# Patient Record
Sex: Female | Born: 1995 | Race: White | Hispanic: No | Marital: Single | Smoking: Former smoker
Health system: Southern US, Community
[De-identification: ages and names within clinical notes are randomized; demographics above are authoritative.]

## PROBLEM LIST (undated history)

## (undated) ENCOUNTER — Inpatient Hospital Stay (HOSPITAL_COMMUNITY): Payer: Self-pay

## (undated) DIAGNOSIS — T7422XA Child sexual abuse, confirmed, initial encounter: Secondary | ICD-10-CM

## (undated) DIAGNOSIS — E119 Type 2 diabetes mellitus without complications: Secondary | ICD-10-CM

## (undated) DIAGNOSIS — F319 Bipolar disorder, unspecified: Secondary | ICD-10-CM

## (undated) DIAGNOSIS — R112 Nausea with vomiting, unspecified: Secondary | ICD-10-CM

## (undated) DIAGNOSIS — Z9889 Other specified postprocedural states: Secondary | ICD-10-CM

## (undated) DIAGNOSIS — T7412XA Child physical abuse, confirmed, initial encounter: Secondary | ICD-10-CM

## (undated) HISTORY — PX: NO PAST SURGERIES: SHX2092

## (undated) HISTORY — PX: HERNIA REPAIR: SHX51

## (undated) HISTORY — DX: Child physical abuse, confirmed, initial encounter: T74.12XA

## (undated) HISTORY — DX: Child sexual abuse, confirmed, initial encounter: T74.22XA

## (undated) HISTORY — DX: Bipolar disorder, unspecified: F31.9

---
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
## 2014-01-04 ENCOUNTER — Other Ambulatory Visit: Payer: Self-pay

## 2014-01-04 ENCOUNTER — Ambulatory Visit (HOSPITAL_COMMUNITY)
Admission: RE | Admit: 2014-01-04 | Discharge: 2014-01-04 | Disposition: A | Discharge status: Home / Self Care | Payer: Medicaid Other | Source: Ambulatory Visit | Attending: Obstetrics & Gynecology | Admitting: Obstetrics & Gynecology

## 2014-01-04 DIAGNOSIS — O351XX0 Maternal care for (suspected) chromosomal abnormality in fetus, not applicable or unspecified: Secondary | ICD-10-CM | POA: Insufficient documentation

## 2014-01-04 DIAGNOSIS — O3510X0 Maternal care for (suspected) chromosomal abnormality in fetus, unspecified, not applicable or unspecified: Secondary | ICD-10-CM | POA: Insufficient documentation

## 2014-01-04 DIAGNOSIS — Z3689 Encounter for other specified antenatal screening: Secondary | ICD-10-CM | POA: Insufficient documentation

## 2014-01-04 DIAGNOSIS — O0990 Supervision of high risk pregnancy, unspecified, unspecified trimester: Secondary | ICD-10-CM | POA: Insufficient documentation

## 2014-01-04 DIAGNOSIS — Z3682 Encounter for antenatal screening for nuchal translucency: Secondary | ICD-10-CM

## 2014-01-05 ENCOUNTER — Encounter: Payer: Self-pay | Admitting: Obstetrics & Gynecology

## 2014-01-17 ENCOUNTER — Ambulatory Visit (INDEPENDENT_AMBULATORY_CARE_PROVIDER_SITE_OTHER): Payer: Medicaid Other | Admitting: Obstetrics & Gynecology

## 2014-01-17 VITALS — BP 118/85 | Wt 207.0 lb

## 2014-01-17 DIAGNOSIS — Z3490 Encounter for supervision of normal pregnancy, unspecified, unspecified trimester: Secondary | ICD-10-CM

## 2014-01-17 DIAGNOSIS — Z23 Encounter for immunization: Secondary | ICD-10-CM

## 2014-01-17 DIAGNOSIS — E669 Obesity, unspecified: Secondary | ICD-10-CM

## 2014-01-17 DIAGNOSIS — O99210 Obesity complicating pregnancy, unspecified trimester: Secondary | ICD-10-CM

## 2014-01-17 DIAGNOSIS — Z3480 Encounter for supervision of other normal pregnancy, unspecified trimester: Secondary | ICD-10-CM

## 2014-01-17 NOTE — Progress Notes (Signed)
P-90 

## 2014-01-17 NOTE — Patient Instructions (Signed)
Return to clinic for any obstetric concerns or go to [facility] for evaluation  

## 2014-01-17 NOTE — Progress Notes (Signed)
1 hr GTT today.  Flu shot to be given today too.Antomy scan ordered.  No other complaints or concerns.  Routine obstetric precautions reviewed.

## 2014-01-18 LAB — GLUCOSE TOLERANCE, 1 HOUR (50G) W/O FASTING: Glucose, 1 Hour GTT: 118 mg/dL (ref 70–140)

## 2014-01-19 ENCOUNTER — Encounter: Payer: Self-pay | Admitting: Obstetrics & Gynecology

## 2014-02-14 ENCOUNTER — Ambulatory Visit (INDEPENDENT_AMBULATORY_CARE_PROVIDER_SITE_OTHER): Payer: Medicaid Other | Admitting: Obstetrics & Gynecology

## 2014-02-14 VITALS — BP 117/82 | Wt 205.0 lb

## 2014-02-14 DIAGNOSIS — Z3480 Encounter for supervision of other normal pregnancy, unspecified trimester: Secondary | ICD-10-CM

## 2014-02-14 DIAGNOSIS — O99210 Obesity complicating pregnancy, unspecified trimester: Secondary | ICD-10-CM

## 2014-02-14 DIAGNOSIS — E669 Obesity, unspecified: Secondary | ICD-10-CM

## 2014-02-14 NOTE — Progress Notes (Signed)
P-105

## 2014-02-14 NOTE — Progress Notes (Signed)
Routine visit. Good FM. No problems. Has anatomy u/s next week. Early glucola today.

## 2014-02-16 ENCOUNTER — Telehealth: Payer: Self-pay | Admitting: *Deleted

## 2014-02-16 LAB — GLUCOSE TOLERANCE, 1 HOUR (50G) W/O FASTING: Glucose, 1 Hour GTT: 158 mg/dL — ABNORMAL HIGH (ref 70–140)

## 2014-02-16 NOTE — Telephone Encounter (Signed)
Attempted to call patient at the numbers provided and was told that she no longer lives there.  The lady that answered gave me a number that she believes is her fathers number.  I attempted this number and left a message that Becky Blake needed to get in touch with our office regarding test results.

## 2014-02-16 NOTE — Telephone Encounter (Signed)
Message copied by Barbara CowerNOGUES, Mansa Willers L on Wed Feb 16, 2014  2:24 PM ------      Message from: Nicholaus BloomVE, MYRA C      Created: Wed Feb 16, 2014  2:04 PM       She will need a 3 hour GTT.      Thanks ------

## 2014-02-22 ENCOUNTER — Ambulatory Visit (HOSPITAL_COMMUNITY): Payer: Medicaid Other

## 2014-02-25 ENCOUNTER — Other Ambulatory Visit: Payer: Medicaid Other

## 2014-02-28 ENCOUNTER — Encounter: Payer: Self-pay | Admitting: *Deleted

## 2014-02-28 ENCOUNTER — Other Ambulatory Visit (INDEPENDENT_AMBULATORY_CARE_PROVIDER_SITE_OTHER): Payer: Medicaid Other | Admitting: *Deleted

## 2014-02-28 DIAGNOSIS — O99810 Abnormal glucose complicating pregnancy: Secondary | ICD-10-CM

## 2014-02-28 NOTE — Progress Notes (Signed)
Pt here today for her 3 hr GTT 

## 2014-03-01 LAB — GLUCOSE TOLERANCE, 3 HOURS
GLUCOSE 3 HOUR GTT: 98 mg/dL (ref 70–144)
GLUCOSE, 2 HOUR-GESTATIONAL: 104 mg/dL (ref 70–164)
Glucose Tolerance, 1 hour: 134 mg/dL (ref 70–189)
Glucose Tolerance, Fasting: 71 mg/dL (ref 70–104)

## 2014-03-03 ENCOUNTER — Ambulatory Visit (HOSPITAL_COMMUNITY)
Admission: RE | Admit: 2014-03-03 | Discharge: 2014-03-03 | Disposition: A | Discharge status: Home / Self Care | Payer: Medicaid Other | Source: Ambulatory Visit | Attending: Obstetrics & Gynecology | Admitting: Obstetrics & Gynecology

## 2014-03-03 ENCOUNTER — Other Ambulatory Visit: Payer: Self-pay | Admitting: Obstetrics & Gynecology

## 2014-03-03 DIAGNOSIS — O99210 Obesity complicating pregnancy, unspecified trimester: Secondary | ICD-10-CM

## 2014-03-03 DIAGNOSIS — Z3689 Encounter for other specified antenatal screening: Secondary | ICD-10-CM | POA: Insufficient documentation

## 2014-03-03 DIAGNOSIS — Z3490 Encounter for supervision of normal pregnancy, unspecified, unspecified trimester: Secondary | ICD-10-CM

## 2014-03-10 ENCOUNTER — Inpatient Hospital Stay (HOSPITAL_COMMUNITY)
Admission: AD | Admit: 2014-03-10 | Discharge: 2014-03-10 | Disposition: A | Discharge status: Home / Self Care | Payer: Medicaid Other | Source: Ambulatory Visit | Attending: Obstetrics & Gynecology | Admitting: Obstetrics & Gynecology

## 2014-03-10 ENCOUNTER — Encounter (HOSPITAL_COMMUNITY): Payer: Self-pay | Admitting: *Deleted

## 2014-03-10 DIAGNOSIS — O26859 Spotting complicating pregnancy, unspecified trimester: Secondary | ICD-10-CM | POA: Insufficient documentation

## 2014-03-10 DIAGNOSIS — O99891 Other specified diseases and conditions complicating pregnancy: Secondary | ICD-10-CM | POA: Insufficient documentation

## 2014-03-10 DIAGNOSIS — F319 Bipolar disorder, unspecified: Secondary | ICD-10-CM

## 2014-03-10 DIAGNOSIS — Z3490 Encounter for supervision of normal pregnancy, unspecified, unspecified trimester: Secondary | ICD-10-CM

## 2014-03-10 DIAGNOSIS — O2390 Unspecified genitourinary tract infection in pregnancy, unspecified trimester: Secondary | ICD-10-CM | POA: Insufficient documentation

## 2014-03-10 DIAGNOSIS — O99340 Other mental disorders complicating pregnancy, unspecified trimester: Secondary | ICD-10-CM

## 2014-03-10 DIAGNOSIS — O9989 Other specified diseases and conditions complicating pregnancy, childbirth and the puerperium: Principal | ICD-10-CM

## 2014-03-10 DIAGNOSIS — B9689 Other specified bacterial agents as the cause of diseases classified elsewhere: Secondary | ICD-10-CM | POA: Insufficient documentation

## 2014-03-10 DIAGNOSIS — O99210 Obesity complicating pregnancy, unspecified trimester: Secondary | ICD-10-CM

## 2014-03-10 DIAGNOSIS — N760 Acute vaginitis: Secondary | ICD-10-CM | POA: Insufficient documentation

## 2014-03-10 DIAGNOSIS — R109 Unspecified abdominal pain: Secondary | ICD-10-CM | POA: Insufficient documentation

## 2014-03-10 DIAGNOSIS — K219 Gastro-esophageal reflux disease without esophagitis: Secondary | ICD-10-CM | POA: Insufficient documentation

## 2014-03-10 DIAGNOSIS — Z87891 Personal history of nicotine dependence: Secondary | ICD-10-CM | POA: Insufficient documentation

## 2014-03-10 DIAGNOSIS — A499 Bacterial infection, unspecified: Secondary | ICD-10-CM | POA: Insufficient documentation

## 2014-03-10 LAB — URINALYSIS, ROUTINE W REFLEX MICROSCOPIC
Bilirubin Urine: NEGATIVE
GLUCOSE, UA: NEGATIVE mg/dL
Hgb urine dipstick: NEGATIVE
Ketones, ur: NEGATIVE mg/dL
LEUKOCYTES UA: NEGATIVE
Nitrite: NEGATIVE
PROTEIN: NEGATIVE mg/dL
Specific Gravity, Urine: 1.01 (ref 1.005–1.030)
UROBILINOGEN UA: 0.2 mg/dL (ref 0.0–1.0)
pH: 6.5 (ref 5.0–8.0)

## 2014-03-10 LAB — WET PREP, GENITAL
Trich, Wet Prep: NONE SEEN
Yeast Wet Prep HPF POC: NONE SEEN

## 2014-03-10 LAB — OB RESULTS CONSOLE GC/CHLAMYDIA
CHLAMYDIA, DNA PROBE: NEGATIVE
Gonorrhea: NEGATIVE

## 2014-03-10 MED ORDER — FAMOTIDINE 40 MG PO TABS: 40.0000 mg | ORAL_TABLET | Freq: Every day | ORAL | Status: DC

## 2014-03-10 MED ORDER — GI COCKTAIL ~~LOC~~: 30.0000 mL | Freq: Once | ORAL | Status: DC

## 2014-03-10 MED ORDER — GI COCKTAIL ~~LOC~~
30.0000 mL | Freq: Once | ORAL | Status: AC
  Administered 2014-03-10: 30 mL via ORAL
  Filled 2014-03-10: qty 30

## 2014-03-10 MED ORDER — METRONIDAZOLE 500 MG PO TABS
500.0000 mg | ORAL_TABLET | Freq: Two times a day (BID) | ORAL | Status: DC
Start: 2014-03-10 — End: 2014-04-11

## 2014-03-10 NOTE — Discharge Instructions (Signed)
Second Trimester of Pregnancy The second trimester is from week 13 through week 28, months 4 through 6. The second trimester is often a time when you feel your best. Your body has also adjusted to being pregnant, and you begin to feel better physically. Usually, morning sickness has lessened or quit completely, you may have more energy, and you may have an increase in appetite. The second trimester is also a time when the fetus is growing rapidly. At the end of the sixth month, the fetus is about 9 inches long and weighs about 1 pounds. You will likely begin to feel the baby move (quickening) between 18 and 20 weeks of the pregnancy. BODY CHANGES Your body goes through many changes during pregnancy. The changes vary from woman to woman.   Your weight will continue to increase. You will notice your lower abdomen bulging out.  You may begin to get stretch marks on your hips, abdomen, and breasts.  You may develop headaches that can be relieved by medicines approved by your caregiver.  You may urinate more often because the fetus is pressing on your bladder.  You may develop or continue to have heartburn as a result of your pregnancy.  You may develop constipation because certain hormones are causing the muscles that push waste through your intestines to slow down.  You may develop hemorrhoids or swollen, bulging veins (varicose veins).  You may have back pain because of the weight gain and pregnancy hormones relaxing your joints between the bones in your pelvis and as a result of a shift in weight and the muscles that support your balance.  Your breasts will continue to grow and be tender.  Your gums may bleed and may be sensitive to brushing and flossing.  Dark spots or blotches (chloasma, mask of pregnancy) may develop on your face. This will likely fade after the baby is born.  A dark line from your belly button to the pubic area (linea nigra) may appear. This will likely fade after the  baby is born. WHAT TO EXPECT AT YOUR PRENATAL VISITS During a routine prenatal visit:  You will be weighed to make sure you and the fetus are growing normally.  Your blood pressure will be taken.  Your abdomen will be measured to track your baby's growth.  The fetal heartbeat will be listened to.  Any test results from the previous visit will be discussed. Your caregiver may ask you:  How you are feeling.  If you are feeling the baby move.  If you have had any abnormal symptoms, such as leaking fluid, bleeding, severe headaches, or abdominal cramping.  If you have any questions. Other tests that may be performed during your second trimester include:  Blood tests that check for:  Low iron levels (anemia).  Gestational diabetes (between 24 and 28 weeks).  Rh antibodies.  Urine tests to check for infections, diabetes, or protein in the urine.  An ultrasound to confirm the proper growth and development of the baby.  An amniocentesis to check for possible genetic problems.  Fetal screens for spina bifida and Down syndrome. HOME CARE INSTRUCTIONS   Avoid all smoking, herbs, alcohol, and unprescribed drugs. These chemicals affect the formation and growth of the baby.  Follow your caregiver's instructions regarding medicine use. There are medicines that are either safe or unsafe to take during pregnancy.  Exercise only as directed by your caregiver. Experiencing uterine cramps is a good sign to stop exercising.  Continue to eat regular,   healthy meals.  Wear a good support bra for breast tenderness.  Do not use hot tubs, steam rooms, or saunas.  Wear your seat belt at all times when driving.  Avoid raw meat, uncooked cheese, cat litter boxes, and soil used by cats. These carry germs that can cause birth defects in the baby.  Take your prenatal vitamins.  Try taking a stool softener (if your caregiver approves) if you develop constipation. Eat more high-fiber foods,  such as fresh vegetables or fruit and whole grains. Drink plenty of fluids to keep your urine clear or pale yellow.  Take warm sitz baths to soothe any pain or discomfort caused by hemorrhoids. Use hemorrhoid cream if your caregiver approves.  If you develop varicose veins, wear support hose. Elevate your feet for 15 minutes, 3 4 times a day. Limit salt in your diet.  Avoid heavy lifting, wear low heel shoes, and practice good posture.  Rest with your legs elevated if you have leg cramps or low back pain.  Visit your dentist if you have not gone yet during your pregnancy. Use a soft toothbrush to brush your teeth and be gentle when you floss.  A sexual relationship may be continued unless your caregiver directs you otherwise.  Continue to go to all your prenatal visits as directed by your caregiver. SEEK MEDICAL CARE IF:   You have dizziness.  You have mild pelvic cramps, pelvic pressure, or nagging pain in the abdominal area.  You have persistent nausea, vomiting, or diarrhea.  You have a bad smelling vaginal discharge.  You have pain with urination. SEEK IMMEDIATE MEDICAL CARE IF:   You have a fever.  You are leaking fluid from your vagina.  You have spotting or bleeding from your vagina.  You have severe abdominal cramping or pain.  You have rapid weight gain or loss.  You have shortness of breath with chest pain.  You notice sudden or extreme swelling of your face, hands, ankles, feet, or legs.  You have not felt your baby move in over an hour.  You have severe headaches that do not go away with medicine.  You have vision changes. Document Released: 12/10/2001 Document Revised: 08/18/2013 Document Reviewed: 02/16/2013 [employer] Patient Information 2014 [employer].  

## 2014-03-10 NOTE — MAU Provider Note (Signed)
Attestation of Attending Supervision of Obstetric Fellow: Evaluation and management procedures were performed by the Obstetric Fellow under my supervision and collaboration.  I have reviewed the Obstetric Fellow's note and chart, and I agree with the management and plan.  Maurina Fawaz, MD, FACOG Attending Obstetrician & Gynecologist Faculty Practice, [facility]   

## 2014-03-10 NOTE — MAU Note (Signed)
Patient state she is having abdominal pain at the fundus mostly to the left side that started this am. States she had pink spotting after the pain started. Not wearing a pad. Feels nausea, no vomiting. Has felt fetal movement before but not today.
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
## 2014-03-11 LAB — GC/CHLAMYDIA PROBE AMP
CT Probe RNA: NEGATIVE
GC PROBE AMP APTIMA: NEGATIVE

## 2014-03-14 ENCOUNTER — Encounter: Payer: Medicaid Other | Admitting: Obstetrics & Gynecology

## 2014-03-15 ENCOUNTER — Encounter: Payer: Self-pay | Admitting: Obstetrics & Gynecology

## 2014-03-15 ENCOUNTER — Ambulatory Visit (INDEPENDENT_AMBULATORY_CARE_PROVIDER_SITE_OTHER): Payer: Medicaid Other | Admitting: Obstetrics & Gynecology

## 2014-03-15 VITALS — BP 126/79 | Wt 210.0 lb

## 2014-03-15 DIAGNOSIS — Z3490 Encounter for supervision of normal pregnancy, unspecified, unspecified trimester: Secondary | ICD-10-CM

## 2014-03-15 DIAGNOSIS — Z3480 Encounter for supervision of other normal pregnancy, unspecified trimester: Secondary | ICD-10-CM

## 2014-03-15 NOTE — Progress Notes (Signed)
P = [phone] 

## 2014-03-15 NOTE — Addendum Note (Signed)
Addended by: Barbara CowerNOGUES, Rielle Schlauch L on: 03/15/2014 03:51 PM   Modules accepted: Orders

## 2014-03-15 NOTE — Patient Instructions (Signed)
Second Trimester of Pregnancy The second trimester is from week 13 through week 28, months 4 through 6. The second trimester is often a time when you feel your best. Your body has also adjusted to being pregnant, and you begin to feel better physically. Usually, morning sickness has lessened or quit completely, you may have more energy, and you may have an increase in appetite. The second trimester is also a time when the fetus is growing rapidly. At the end of the sixth month, the fetus is about 9 inches long and weighs about 1 pounds. You will likely begin to feel the baby move (quickening) between 18 and 20 weeks of the pregnancy. BODY CHANGES Your body goes through many changes during pregnancy. The changes vary from woman to woman.   Your weight will continue to increase. You will notice your lower abdomen bulging out.  You may begin to get stretch marks on your hips, abdomen, and breasts.  You may develop headaches that can be relieved by medicines approved by your caregiver.  You may urinate more often because the fetus is pressing on your bladder.  You may develop or continue to have heartburn as a result of your pregnancy.  You may develop constipation because certain hormones are causing the muscles that push waste through your intestines to slow down.  You may develop hemorrhoids or swollen, bulging veins (varicose veins).  You may have back pain because of the weight gain and pregnancy hormones relaxing your joints between the bones in your pelvis and as a result of a shift in weight and the muscles that support your balance.  Your breasts will continue to grow and be tender.  Your gums may bleed and may be sensitive to brushing and flossing.  Dark spots or blotches (chloasma, mask of pregnancy) may develop on your face. This will likely fade after the baby is born.  A dark line from your belly button to the pubic area (linea nigra) may appear. This will likely fade after the  baby is born. WHAT TO EXPECT AT YOUR PRENATAL VISITS During a routine prenatal visit:  You will be weighed to make sure you and the fetus are growing normally.  Your blood pressure will be taken.  Your abdomen will be measured to track your baby's growth.  The fetal heartbeat will be listened to.  Any test results from the previous visit will be discussed. Your caregiver may ask you:  How you are feeling.  If you are feeling the baby move.  If you have had any abnormal symptoms, such as leaking fluid, bleeding, severe headaches, or abdominal cramping.  If you have any questions. Other tests that may be performed during your second trimester include:  Blood tests that check for:  Low iron levels (anemia).  Gestational diabetes (between 24 and 28 weeks).  Rh antibodies.  Urine tests to check for infections, diabetes, or protein in the urine.  An ultrasound to confirm the proper growth and development of the baby.  An amniocentesis to check for possible genetic problems.  Fetal screens for spina bifida and Down syndrome. HOME CARE INSTRUCTIONS   Avoid all smoking, herbs, alcohol, and unprescribed drugs. These chemicals affect the formation and growth of the baby.  Follow your caregiver's instructions regarding medicine use. There are medicines that are either safe or unsafe to take during pregnancy.  Exercise only as directed by your caregiver. Experiencing uterine cramps is a good sign to stop exercising.  Continue to eat regular,   healthy meals.  Wear a good support bra for breast tenderness.  Do not use hot tubs, steam rooms, or saunas.  Wear your seat belt at all times when driving.  Avoid raw meat, uncooked cheese, cat litter boxes, and soil used by cats. These carry germs that can cause birth defects in the baby.  Take your prenatal vitamins.  Try taking a stool softener (if your caregiver approves) if you develop constipation. Eat more high-fiber foods,  such as fresh vegetables or fruit and whole grains. Drink plenty of fluids to keep your urine clear or pale yellow.  Take warm sitz baths to soothe any pain or discomfort caused by hemorrhoids. Use hemorrhoid cream if your caregiver approves.  If you develop varicose veins, wear support hose. Elevate your feet for 15 minutes, 3 4 times a day. Limit salt in your diet.  Avoid heavy lifting, wear low heel shoes, and practice good posture.  Rest with your legs elevated if you have leg cramps or low back pain.  Visit your dentist if you have not gone yet during your pregnancy. Use a soft toothbrush to brush your teeth and be gentle when you floss.  A sexual relationship may be continued unless your caregiver directs you otherwise.  Continue to go to all your prenatal visits as directed by your caregiver. SEEK MEDICAL CARE IF:   You have dizziness.  You have mild pelvic cramps, pelvic pressure, or nagging pain in the abdominal area.  You have persistent nausea, vomiting, or diarrhea.  You have a bad smelling vaginal discharge.  You have pain with urination. SEEK IMMEDIATE MEDICAL CARE IF:   You have a fever.  You are leaking fluid from your vagina.  You have spotting or bleeding from your vagina.  You have severe abdominal cramping or pain.  You have rapid weight gain or loss.  You have shortness of breath with chest pain.  You notice sudden or extreme swelling of your face, hands, ankles, feet, or legs.  You have not felt your baby move in over an hour.  You have severe headaches that do not go away with medicine.  You have vision changes. Document Released: 12/10/2001 Document Revised: 08/18/2013 Document Reviewed: 02/16/2013 [employer] Patient Information 2014 [employer].  

## 2014-03-15 NOTE — Progress Notes (Signed)
Quad screen today F/u anatomy scan No problems

## 2014-03-17 LAB — ALPHA FETOPROTEIN, MATERNAL
AFP: 130 IU/mL
Curr Gest Age: 22.5 wks.days
MOM FOR AFP: 2.08
Open Spina bifida: NEGATIVE

## 2014-03-21 ENCOUNTER — Encounter: Payer: Self-pay | Admitting: Obstetrics & Gynecology

## 2014-04-11 ENCOUNTER — Ambulatory Visit (INDEPENDENT_AMBULATORY_CARE_PROVIDER_SITE_OTHER): Payer: Medicaid Other | Admitting: Family Medicine

## 2014-04-11 ENCOUNTER — Encounter: Payer: Self-pay | Admitting: Family Medicine

## 2014-04-11 VITALS — BP 112/80 | Wt 216.0 lb

## 2014-04-11 DIAGNOSIS — Z3480 Encounter for supervision of other normal pregnancy, unspecified trimester: Secondary | ICD-10-CM

## 2014-04-11 DIAGNOSIS — Z3490 Encounter for supervision of normal pregnancy, unspecified, unspecified trimester: Secondary | ICD-10-CM

## 2014-04-11 NOTE — Progress Notes (Signed)
P-[phone] 

## 2014-04-11 NOTE — Patient Instructions (Signed)
Third Trimester of Pregnancy The third trimester is from week 29 through week 42, months 7 through 9. The third trimester is a time when the fetus is growing rapidly. At the end of the ninth month, the fetus is about 20 inches in length and weighs 6 10 pounds.  BODY CHANGES Your body goes through many changes during pregnancy. The changes vary from woman to woman.   Your weight will continue to increase. You can expect to gain 25 35 pounds (11 16 kg) by the end of the pregnancy.  You may begin to get stretch marks on your hips, abdomen, and breasts.  You may urinate more often because the fetus is moving lower into your pelvis and pressing on your bladder.  You may develop or continue to have heartburn as a result of your pregnancy.  You may develop constipation because certain hormones are causing the muscles that push waste through your intestines to slow down.  You may develop hemorrhoids or swollen, bulging veins (varicose veins).  You may have pelvic pain because of the weight gain and pregnancy hormones relaxing your joints between the bones in your pelvis. Back aches may result from over exertion of the muscles supporting your posture.  Your breasts will continue to grow and be tender. A yellow discharge may leak from your breasts called colostrum.  Your belly button may stick out.  You may feel short of breath because of your expanding uterus.  You may notice the fetus "dropping," or moving lower in your abdomen.  You may have a bloody mucus discharge. This usually occurs a few days to a week before labor begins.  Your cervix becomes thin and soft (effaced) near your due date. WHAT TO EXPECT AT YOUR PRENATAL EXAMS  You will have prenatal exams every 2 weeks until week 36. Then, you will have weekly prenatal exams. During a routine prenatal visit:  You will be weighed to make sure you and the fetus are growing normally.  Your blood pressure is taken.  Your abdomen will  be measured to track your baby's growth.  The fetal heartbeat will be listened to.  Any test results from the previous visit will be discussed.  You may have a cervical check near your due date to see if you have effaced. At around 36 weeks, your caregiver will check your cervix. At the same time, your caregiver will also perform a test on the secretions of the vaginal tissue. This test is to determine if a type of bacteria, Group B streptococcus, is present. Your caregiver will explain this further. Your caregiver may ask you:  What your birth plan is.  How you are feeling.  If you are feeling the baby move.  If you have had any abnormal symptoms, such as leaking fluid, bleeding, severe headaches, or abdominal cramping.  If you have any questions. Other tests or screenings that may be performed during your third trimester include:  Blood tests that check for low iron levels (anemia).  Fetal testing to check the health, activity level, and growth of the fetus. Testing is done if you have certain medical conditions or if there are problems during the pregnancy. FALSE LABOR You may feel small, irregular contractions that eventually go away. These are called Braxton Hicks contractions, or false labor. Contractions may last for hours, days, or even weeks before true labor sets in. If contractions come at regular intervals, intensify, or become painful, it is best to be seen by your caregiver.    SIGNS OF LABOR   Menstrual-like cramps.  Contractions that are 5 minutes apart or less.  Contractions that start on the top of the uterus and spread down to the lower abdomen and back.  A sense of increased pelvic pressure or back pain.  A watery or bloody mucus discharge that comes from the vagina. If you have any of these signs before the 37th week of pregnancy, call your caregiver right away. You need to go to the hospital to get checked immediately. HOME CARE INSTRUCTIONS   Avoid all  smoking, herbs, alcohol, and unprescribed drugs. These chemicals affect the formation and growth of the baby.  Follow your caregiver's instructions regarding medicine use. There are medicines that are either safe or unsafe to take during pregnancy.  Exercise only as directed by your caregiver. Experiencing uterine cramps is a good sign to stop exercising.  Continue to eat regular, healthy meals.  Wear a good support bra for breast tenderness.  Do not use hot tubs, steam rooms, or saunas.  Wear your seat belt at all times when driving.  Avoid raw meat, uncooked cheese, cat litter boxes, and soil used by cats. These carry germs that can cause birth defects in the baby.  Take your prenatal vitamins.  Try taking a stool softener (if your caregiver approves) if you develop constipation. Eat more high-fiber foods, such as fresh vegetables or fruit and whole grains. Drink plenty of fluids to keep your urine clear or pale yellow.  Take warm sitz baths to soothe any pain or discomfort caused by hemorrhoids. Use hemorrhoid cream if your caregiver approves.  If you develop varicose veins, wear support hose. Elevate your feet for 15 minutes, 3 4 times a day. Limit salt in your diet.  Avoid heavy lifting, wear low heal shoes, and practice good posture.  Rest a lot with your legs elevated if you have leg cramps or low back pain.  Visit your dentist if you have not gone during your pregnancy. Use a soft toothbrush to brush your teeth and be gentle when you floss.  A sexual relationship may be continued unless your caregiver directs you otherwise.  Do not travel far distances unless it is absolutely necessary and only with the approval of your caregiver.  Take prenatal classes to understand, practice, and ask questions about the labor and delivery.  Make a trial run to the hospital.  Pack your hospital bag.  Prepare the baby's nursery.  Continue to go to all your prenatal visits as directed  by your caregiver. SEEK MEDICAL CARE IF:  You are unsure if you are in labor or if your water has broken.  You have dizziness.  You have mild pelvic cramps, pelvic pressure, or nagging pain in your abdominal area.  You have persistent nausea, vomiting, or diarrhea.  You have a bad smelling vaginal discharge.  You have pain with urination. SEEK IMMEDIATE MEDICAL CARE IF:   You have a fever.  You are leaking fluid from your vagina.  You have spotting or bleeding from your vagina.  You have severe abdominal cramping or pain.  You have rapid weight loss or gain.  You have shortness of breath with chest pain.  You notice sudden or extreme swelling of your face, hands, ankles, feet, or legs.  You have not felt your baby move in over an hour.  You have severe headaches that do not go away with medicine.  You have vision changes. Document Released: 12/10/2001 Document Revised: 08/18/2013 Document Reviewed:   02/16/2013 [employer] Patient Information 2014 [employer].  Breastfeeding Deciding to breastfeed is one of the best choices you can make for you and your baby. A change in hormones during pregnancy causes your breast tissue to grow and increases the number and size of your milk ducts. These hormones also allow proteins, sugars, and fats from your blood supply to make breast milk in your milk-producing glands. Hormones prevent breast milk from being released before your baby is born as well as prompt milk flow after birth. Once breastfeeding has begun, thoughts of your baby, as well as his or her sucking or crying, can stimulate the release of milk from your milk-producing glands.  BENEFITS OF BREASTFEEDING For Your Baby  Your first milk (colostrum) helps your baby's digestive system function better.   There are antibodies in your milk that help your baby fight off infections.   Your baby has a lower incidence of asthma, allergies, and sudden infant death syndrome.    The nutrients in breast milk are better for your baby than infant formulas and are designed uniquely for your baby's needs.   Breast milk improves your baby's brain development.   Your baby is less likely to develop other conditions, such as childhood obesity, asthma, or type 2 diabetes mellitus.  For You   Breastfeeding helps to create a very special bond between you and your baby.   Breastfeeding is convenient. Breast milk is always available at the correct temperature and costs nothing.   Breastfeeding helps to burn calories and helps you lose the weight gained during pregnancy.   Breastfeeding makes your uterus contract to its prepregnancy size faster and slows bleeding (lochia) after you give birth.   Breastfeeding helps to lower your risk of developing type 2 diabetes mellitus, osteoporosis, and breast or ovarian cancer later in life. SIGNS THAT YOUR BABY IS HUNGRY Early Signs of Hunger  Increased alertness or activity.  Stretching.  Movement of the head from side to side.  Movement of the head and opening of the mouth when the corner of the mouth or cheek is stroked (rooting).  Increased sucking sounds, smacking lips, cooing, sighing, or squeaking.  Hand-to-mouth movements.  Increased sucking of fingers or hands. Late Signs of Hunger  Fussing.  Intermittent crying. Extreme Signs of Hunger Signs of extreme hunger will require calming and consoling before your baby will be able to breastfeed successfully. Do not wait for the following signs of extreme hunger to occur before you initiate breastfeeding:   Restlessness.  A loud, strong cry.   Screaming. BREASTFEEDING BASICS Breastfeeding Initiation  Find a comfortable place to sit or lie down, with your neck and back well supported.  Place a pillow or rolled up blanket under your baby to bring him or her to the level of your breast (if you are seated). Nursing pillows are specially designed to help  support your arms and your baby while you breastfeed.  Make sure that your baby's abdomen is facing your abdomen.   Gently massage your breast. With your fingertips, massage from your chest wall toward your nipple in a circular motion. This encourages milk flow. You may need to continue this action during the feeding if your milk flows slowly.  Support your breast with 4 fingers underneath and your thumb above your nipple. Make sure your fingers are well away from your nipple and your baby's mouth.   Stroke your baby's lips gently with your finger or nipple.   When your baby's mouth is   open wide enough, quickly bring your baby to your breast, placing your entire nipple and as much of the colored area around your nipple (areola) as possible into your baby's mouth.   More areola should be visible above your baby's upper lip than below the lower lip.   Your baby's tongue should be between his or her lower gum and your breast.   Ensure that your baby's mouth is correctly positioned around your nipple (latched). Your baby's lips should create a seal on your breast and be turned out (everted).  It is common for your baby to suck about 2 3 minutes in order to start the flow of breast milk. Latching Teaching your baby how to latch on to your breast properly is very important. An improper latch can cause nipple pain and decreased milk supply for you and poor weight gain in your baby. Also, if your baby is not latched onto your nipple properly, he or she may swallow some air during feeding. This can make your baby fussy. Burping your baby when you switch breasts during the feeding can help to get rid of the air. However, teaching your baby to latch on properly is still the best way to prevent fussiness from swallowing air while breastfeeding. Signs that your baby has successfully latched on to your nipple:    Silent tugging or silent sucking, without causing you pain.   Swallowing heard  between every 3 4 sucks.    Muscle movement above and in front of his or her ears while sucking.  Signs that your baby has not successfully latched on to nipple:   Sucking sounds or smacking sounds from your baby while breastfeeding.  Nipple pain. If you think your baby has not latched on correctly, slip your finger into the corner of your baby's mouth to break the suction and place it between your baby's gums. Attempt breastfeeding initiation again. Signs of Successful Breastfeeding Signs from your baby:   A gradual decrease in the number of sucks or complete cessation of sucking.   Falling asleep.   Relaxation of his or her body.   Retention of a small amount of milk in his or her mouth.   Letting go of your breast by himself or herself. Signs from you:  Breasts that have increased in firmness, weight, and size 1 3 hours after feeding.   Breasts that are softer immediately after breastfeeding.  Increased milk volume, as well as a change in milk consistency and color by the 5th day of breastfeeding.   Nipples that are not sore, cracked, or bleeding. Signs That Your Baby is Getting Enough Milk  Wetting at least 3 diapers in a 24-hour period. The urine should be clear and pale yellow by age 5 days.  At least 3 stools in a 24-hour period by age 5 days. The stool should be soft and yellow.  At least 3 stools in a 24-hour period by age 7 days. The stool should be seedy and yellow.  No loss of weight greater than 10% of birth weight during the first 3 days of age.  Average weight gain of 4 7 ounces (120 210 mL) per week after age 4 days.  Consistent daily weight gain by age 5 days, without weight loss after the age of 2 weeks. After a feeding, your baby may spit up a small amount. This is common. BREASTFEEDING FREQUENCY AND DURATION Frequent feeding will help you make more milk and can prevent sore nipples and breast engorgement.   Breastfeed when you feel the need to  reduce the fullness of your breasts or when your baby shows signs of hunger. This is called "breastfeeding on demand." Avoid introducing a pacifier to your baby while you are working to establish breastfeeding (the first 4 6 weeks after your baby is born). After this time you may choose to use a pacifier. Research has shown that pacifier use during the first year of a baby's life decreases the risk of sudden infant death syndrome (SIDS). Allow your baby to feed on each breast as long as he or she wants. Breastfeed until your baby is finished feeding. When your baby unlatches or falls asleep while feeding from the first breast, offer the second breast. Because newborns are often sleepy in the first few weeks of life, you may need to awaken your baby to get him or her to feed. Breastfeeding times will vary from baby to baby. However, the following rules can serve as a guide to help you ensure that your baby is properly fed:  Newborns (babies 4 weeks of age or younger) may breastfeed every 1 3 hours.  Newborns should not go longer than 3 hours during the day or 5 hours during the night without breastfeeding.  You should breastfeed your baby a minimum of 8 times in a 24-hour period until you begin to introduce solid foods to your baby at around 6 months of age. BREAST MILK PUMPING Pumping and storing breast milk allows you to ensure that your baby is exclusively fed your breast milk, even at times when you are unable to breastfeed. This is especially important if you are going back to work while you are still breastfeeding or when you are not able to be present during feedings. Your lactation consultant can give you guidelines on how long it is safe to store breast milk.  A breast pump is a machine that allows you to pump milk from your breast into a sterile bottle. The pumped breast milk can then be stored in a refrigerator or freezer. Some breast pumps are operated by hand, while others use electricity. Ask  your lactation consultant which type will work best for you. Breast pumps can be purchased, but some hospitals and breastfeeding support groups lease breast pumps on a monthly basis. A lactation consultant can teach you how to hand express breast milk, if you prefer not to use a pump.  CARING FOR YOUR BREASTS WHILE YOU BREASTFEED Nipples can become dry, cracked, and sore while breastfeeding. The following recommendations can help keep your breasts moisturized and healthy:  Avoid using soap on your nipples.   Wear a supportive bra. Although not required, special nursing bras and tank tops are designed to allow access to your breasts for breastfeeding without taking off your entire bra or top. Avoid wearing underwire style bras or extremely tight bras.  Air dry your nipples for 3 4minutes after each feeding.   Use only cotton bra pads to absorb leaked breast milk. Leaking of breast milk between feedings is normal.   Use lanolin on your nipples after breastfeeding. Lanolin helps to maintain your skin's normal moisture barrier. If you use pure lanolin you do not need to wash it off before feeding your baby again. Pure lanolin is not toxic to your baby. You may also hand express a few drops of breast milk and gently massage that milk into your nipples and allow the milk to air dry. In the first few weeks after giving birth, some women   experience extremely full breasts (engorgement). Engorgement can make your breasts feel heavy, warm, and tender to the touch. Engorgement peaks within 3 5 days after you give birth. The following recommendations can help ease engorgement:  Completely empty your breasts while breastfeeding or pumping. You may want to start by applying warm, moist heat (in the shower or with warm water-soaked hand towels) just before feeding or pumping. This increases circulation and helps the milk flow. If your baby does not completely empty your breasts while breastfeeding, pump any extra  milk after he or she is finished.  Wear a snug bra (nursing or regular) or tank top for 1 2 days to signal your body to slightly decrease milk production.  Apply ice packs to your breasts, unless this is too uncomfortable for you.  Make sure that your baby is latched on and positioned properly while breastfeeding. If engorgement persists after 48 hours of following these recommendations, contact your health care provider or a lactation consultant. OVERALL HEALTH CARE RECOMMENDATIONS WHILE BREASTFEEDING  Eat healthy foods. Alternate between meals and snacks, eating 3 of each per day. Because what you eat affects your breast milk, some of the foods may make your baby more irritable than usual. Avoid eating these foods if you are sure that they are negatively affecting your baby.  Drink milk, fruit juice, and water to satisfy your thirst (about 10 glasses a day).   Rest often, relax, and continue to take your prenatal vitamins to prevent fatigue, stress, and anemia.  Continue breast self-awareness checks.  Avoid chewing and smoking tobacco.  Avoid alcohol and drug use. Some medicines that may be harmful to your baby can pass through breast milk. It is important to ask your health care provider before taking any medicine, including all over-the-counter and prescription medicine as well as vitamin and herbal supplements. It is possible to become pregnant while breastfeeding. If birth control is desired, ask your health care provider about options that will be safe for your baby. SEEK MEDICAL CARE IF:   You feel like you want to stop breastfeeding or have become frustrated with breastfeeding.  You have painful breasts or nipples.  Your nipples are cracked or bleeding.  Your breasts are red, tender, or warm.  You have a swollen area on either breast.  You have a fever or chills.  You have nausea or vomiting.  You have drainage other than breast milk from your nipples.  Your breasts  do not become full before feedings by the 5th day after you give birth.  You feel sad and depressed.  Your baby is too sleepy to eat well.  Your baby is having trouble sleeping.   Your baby is wetting less than 3 diapers in a 24-hour period.  Your baby has less than 3 stools in a 24-hour period.  Your baby's skin or the white part of his or her eyes becomes yellow.   Your baby is not gaining weight by 5 days of age. SEEK IMMEDIATE MEDICAL CARE IF:   Your baby is overly tired (lethargic) and does not want to wake up and feed.  Your baby develops an unexplained fever. Document Released: 12/16/2005 Document Revised: 08/18/2013 Document Reviewed: 06/09/2013 [employer] Patient Information 2014 [employer].  

## 2014-04-11 NOTE — Progress Notes (Signed)
S>D--needs f/u for anatomy--will schedule and include growth. 28 wk labs and TDaP next visit.

## 2014-04-21 ENCOUNTER — Inpatient Hospital Stay (HOSPITAL_COMMUNITY)
Admission: AD | Admit: 2014-04-21 | Discharge: 2014-04-21 | Disposition: A | Discharge status: Home / Self Care | Payer: Medicaid Other | Source: Ambulatory Visit | Attending: Obstetrics & Gynecology | Admitting: Obstetrics & Gynecology

## 2014-04-21 ENCOUNTER — Encounter (HOSPITAL_COMMUNITY): Payer: Self-pay | Admitting: *Deleted

## 2014-04-21 DIAGNOSIS — R300 Dysuria: Secondary | ICD-10-CM | POA: Insufficient documentation

## 2014-04-21 DIAGNOSIS — O2340 Unspecified infection of urinary tract in pregnancy, unspecified trimester: Secondary | ICD-10-CM

## 2014-04-21 DIAGNOSIS — O2390 Unspecified genitourinary tract infection in pregnancy, unspecified trimester: Secondary | ICD-10-CM

## 2014-04-21 DIAGNOSIS — R3915 Urgency of urination: Secondary | ICD-10-CM | POA: Insufficient documentation

## 2014-04-21 DIAGNOSIS — N390 Urinary tract infection, site not specified: Secondary | ICD-10-CM

## 2014-04-21 DIAGNOSIS — O9989 Other specified diseases and conditions complicating pregnancy, childbirth and the puerperium: Principal | ICD-10-CM

## 2014-04-21 DIAGNOSIS — O99891 Other specified diseases and conditions complicating pregnancy: Secondary | ICD-10-CM | POA: Insufficient documentation

## 2014-04-21 LAB — URINE MICROSCOPIC-ADD ON

## 2014-04-21 LAB — URINALYSIS, ROUTINE W REFLEX MICROSCOPIC
Bilirubin Urine: NEGATIVE
Glucose, UA: NEGATIVE mg/dL
KETONES UR: 15 mg/dL — AB
Nitrite: NEGATIVE
Protein, ur: 100 mg/dL — AB
Specific Gravity, Urine: 1.02 (ref 1.005–1.030)
UROBILINOGEN UA: 0.2 mg/dL (ref 0.0–1.0)
pH: 6 (ref 5.0–8.0)

## 2014-04-21 MED ORDER — NITROFURANTOIN MONOHYD MACRO 100 MG PO CAPS: 100.0000 mg | ORAL_CAPSULE | Freq: Two times a day (BID) | ORAL | Status: DC

## 2014-04-21 MED ORDER — PHENAZOPYRIDINE HCL 200 MG PO TABS: 200.0000 mg | ORAL_TABLET | Freq: Three times a day (TID) | ORAL | Status: DC | PRN

## 2014-04-21 NOTE — Discharge Instructions (Signed)
Pregnancy and Urinary Tract Infection  A urinary tract infection (UTI) is a bacterial infection of the urinary tract. Infection of the urinary tract can include the ureters, kidneys (pyelonephritis), bladder (cystitis), and urethra (urethritis). All pregnant women should be screened for bacteria in the urinary tract. Identifying and treating a UTI will decrease the risk of preterm labor and developing more serious infections in both the mother and baby.  CAUSES  Bacteria germs cause almost all UTIs.   RISK FACTORS  Many factors can increase your chances of getting a UTI during pregnancy. These include:  · Having a short urethra.  · Poor toilet and hygiene habits.  · Sexual intercourse.  · Blockage of urine along the urinary tract.  · Problems with the pelvic muscles or nerves.  · Diabetes.  · Obesity.  · Bladder problems after having several children.  · Previous history of UTI.  SIGNS AND SYMPTOMS   · Pain, burning, or a stinging feeling when urinating.  · Suddenly feeling the need to urinate right away (urgency).  · Loss of bladder control (urinary incontinence).  · Frequent urination, more than is common with pregnancy.  · Lower abdominal or back discomfort.  · Cloudy urine.  · Blood in the urine (hematuria).  · Fever.   When the kidneys are infected, the symptoms may be:  · Back pain.  · Flank pain on the right side more so than the left.  · Fever.  · Chills.  · Nausea.  · Vomiting.  DIAGNOSIS   A urinary tract infection is usually diagnosed through urine tests. Additional tests and procedures are sometimes done. These may include:  · Ultrasound exam of the kidneys, ureters, bladder, and urethra.  · Looking in the bladder with a lighted tube (cystoscopy).  TREATMENT  Typically, UTIs can be treated with antibiotic medicines.   HOME CARE INSTRUCTIONS   · Only take over-the-counter or prescription medicines as directed by your health care provider. If you were prescribed antibiotics, take them as directed. Finish  them even if you start to feel better.  · Drink enough fluids to keep your urine clear or pale yellow.  · Do not have sexual intercourse until the infection is gone and your health care provider says it is okay.  · Make sure you are tested for UTIs throughout your pregnancy. These infections often come back.   Preventing a UTI in the Future  · Practice good toilet habits. Always wipe from front to back. Use the tissue only once.  · Do not hold your urine. Empty your bladder as soon as possible when the urge comes.  · Do not douche or use deodorant sprays.  · Wash with soap and warm water around the genital area and the anus.  · Empty your bladder before and after sexual intercourse.  · Wear underwear with a cotton crotch.  · Avoid caffeine and carbonated drinks. They can irritate the bladder.  · Drink cranberry juice or take cranberry pills. This may decrease the risk of getting a UTI.  · Do not drink alcohol.  · Keep all your appointments and tests as scheduled.   SEEK MEDICAL CARE IF:   · Your symptoms get worse.  · You are still having fevers 2 or more days after treatment begins.  · You have a rash.  · You feel that you are having problems with medicines prescribed.  · You have abnormal vaginal discharge.  SEEK IMMEDIATE MEDICAL CARE IF:   · You have back or flank   pain.  · You have chills.  · You have blood in your urine.  · You have nausea and vomiting.  · You have contractions of your uterus.  · You have a gush of fluid from the vagina.  MAKE SURE YOU:  · Understand these instructions.    · Will watch your condition.    · Will get help right away if you are not doing well or get worse.    Document Released: 04/12/2011 Document Revised: 10/06/2013 Document Reviewed: 07/15/2013  [employer]® Patient Information ©2014 [employer].

## 2014-04-21 NOTE — MAU Note (Signed)
Patient states she has had urinary urgency and pain since yesterday. Denies contractions, bleeding or leaking and reports fetal movement.

## 2014-04-25 ENCOUNTER — Ambulatory Visit (HOSPITAL_COMMUNITY)
Admission: RE | Admit: 2014-04-25 | Discharge: 2014-04-25 | Disposition: A | Discharge status: Home / Self Care | Payer: Medicaid Other | Source: Ambulatory Visit | Attending: Family Medicine | Admitting: Family Medicine

## 2014-04-25 ENCOUNTER — Encounter: Payer: Medicaid Other | Admitting: Obstetrics & Gynecology

## 2014-04-25 ENCOUNTER — Ambulatory Visit (INDEPENDENT_AMBULATORY_CARE_PROVIDER_SITE_OTHER): Payer: Medicaid Other | Admitting: Obstetrics & Gynecology

## 2014-04-25 ENCOUNTER — Encounter: Payer: Self-pay | Admitting: *Deleted

## 2014-04-25 ENCOUNTER — Encounter: Payer: Self-pay | Admitting: Obstetrics & Gynecology

## 2014-04-25 VITALS — BP 123/79 | HR 106 | Wt 217.8 lb

## 2014-04-25 DIAGNOSIS — Z3490 Encounter for supervision of normal pregnancy, unspecified, unspecified trimester: Secondary | ICD-10-CM

## 2014-04-25 DIAGNOSIS — Z23 Encounter for immunization: Secondary | ICD-10-CM

## 2014-04-25 DIAGNOSIS — Z3689 Encounter for other specified antenatal screening: Secondary | ICD-10-CM | POA: Insufficient documentation

## 2014-04-25 DIAGNOSIS — E669 Obesity, unspecified: Secondary | ICD-10-CM

## 2014-04-25 DIAGNOSIS — Z3400 Encounter for supervision of normal first pregnancy, unspecified trimester: Secondary | ICD-10-CM

## 2014-04-25 DIAGNOSIS — O99210 Obesity complicating pregnancy, unspecified trimester: Secondary | ICD-10-CM

## 2014-04-25 LAB — CBC WITH DIFFERENTIAL/PLATELET
BASOS PCT: 0 % (ref 0–1)
Basophils Absolute: 0 10*3/uL (ref 0.0–0.1)
EOS PCT: 0 % (ref 0–5)
Eosinophils Absolute: 0 10*3/uL (ref 0.0–0.7)
HEMATOCRIT: 35 % — AB (ref 36.0–46.0)
HEMOGLOBIN: 12.1 g/dL (ref 12.0–15.0)
Lymphocytes Relative: 13 % (ref 12–46)
Lymphs Abs: 1.6 10*3/uL (ref 0.7–4.0)
MCH: 29.7 pg (ref 26.0–34.0)
MCHC: 34.6 g/dL (ref 30.0–36.0)
MCV: 85.8 fL (ref 78.0–100.0)
MONO ABS: 0.5 10*3/uL (ref 0.1–1.0)
MONOS PCT: 4 % (ref 3–12)
NEUTROS ABS: 10.3 10*3/uL — AB (ref 1.7–7.7)
Neutrophils Relative %: 83 % — ABNORMAL HIGH (ref 43–77)
Platelets: 177 10*3/uL (ref 150–400)
RBC: 4.08 MIL/uL (ref 3.87–5.11)
RDW: 13.3 % (ref 11.5–15.5)
WBC: 12.4 10*3/uL — ABNORMAL HIGH (ref 4.0–10.5)

## 2014-04-25 NOTE — Progress Notes (Signed)
Routine visit. Good FM. No problems. U/S for growth and follow up today.

## 2014-04-25 NOTE — Progress Notes (Signed)
Patient is doing well, having her 28 week labs today as well as her TDAP vaccination.

## 2014-04-26 ENCOUNTER — Encounter: Payer: Self-pay | Admitting: Family Medicine

## 2014-04-26 LAB — RPR

## 2014-04-26 LAB — GLUCOSE TOLERANCE, 1 HOUR (50G) W/O FASTING: Glucose, 1 Hour GTT: 137 mg/dL (ref 70–140)

## 2014-04-26 LAB — HIV ANTIBODY (ROUTINE TESTING W REFLEX): HIV: NONREACTIVE

## 2014-04-27 ENCOUNTER — Telehealth: Payer: Self-pay | Admitting: *Deleted

## 2014-04-27 ENCOUNTER — Encounter: Payer: Self-pay | Admitting: Obstetrics & Gynecology

## 2014-04-27 NOTE — Telephone Encounter (Signed)
Called and left patient a message to return call regarding test results.

## 2014-04-27 NOTE — Telephone Encounter (Signed)
Message copied by Barbara CowerNOGUES, Cotina Freedman L on Wed Apr 27, 2014  3:25 PM ------      Message from: Nicholaus BloomVE, MYRA C      Created: Wed Apr 27, 2014  8:51 AM       She needs a 3 hour GTT.      Thanks ------

## 2014-04-29 ENCOUNTER — Other Ambulatory Visit (INDEPENDENT_AMBULATORY_CARE_PROVIDER_SITE_OTHER): Payer: Medicaid Other | Admitting: *Deleted

## 2014-04-29 DIAGNOSIS — R7309 Other abnormal glucose: Secondary | ICD-10-CM

## 2014-04-29 DIAGNOSIS — O99810 Abnormal glucose complicating pregnancy: Secondary | ICD-10-CM

## 2014-04-29 NOTE — Progress Notes (Signed)
Pt here today for a 3 hr GTT.  

## 2014-04-30 LAB — GLUCOSE TOLERANCE, 3 HOURS
GLUCOSE, 1 HOUR-GESTATIONAL: 177 mg/dL (ref 70–189)
GLUCOSE, 2 HOUR-GESTATIONAL: 188 mg/dL — AB (ref 70–164)
Glucose Tolerance, Fasting: 83 mg/dL (ref 70–104)
Glucose, GTT - 3 Hour: 182 mg/dL — ABNORMAL HIGH (ref 70–144)

## 2014-05-02 ENCOUNTER — Telehealth: Payer: Self-pay | Admitting: *Deleted

## 2014-05-02 NOTE — Telephone Encounter (Signed)
Patient notified of test results.  [facility]

## 2014-05-05 ENCOUNTER — Encounter: Payer: Medicaid Other | Attending: Obstetrics & Gynecology | Admitting: *Deleted

## 2014-05-05 ENCOUNTER — Ambulatory Visit (HOSPITAL_COMMUNITY)
Admission: RE | Admit: 2014-05-05 | Discharge: 2014-05-05 | Disposition: A | Discharge status: Home / Self Care | Payer: Medicaid Other | Source: Ambulatory Visit | Attending: Obstetrics & Gynecology | Admitting: Obstetrics & Gynecology

## 2014-05-05 DIAGNOSIS — O99810 Abnormal glucose complicating pregnancy: Secondary | ICD-10-CM

## 2014-05-05 DIAGNOSIS — Z713 Dietary counseling and surveillance: Secondary | ICD-10-CM | POA: Insufficient documentation

## 2014-05-05 MED ORDER — GLUCOSE BLOOD VI STRP: ORAL_STRIP | Status: DC

## 2014-05-05 MED ORDER — ACCU-CHEK FASTCLIX LANCETS MISC: 1.0000 | Freq: Four times a day (QID) | Status: DC

## 2014-05-05 NOTE — Progress Notes (Signed)
  Patient was seen on 05/05/14 for Gestational Diabetes self-management class at the [facility]. The following learning objectives were met by the patient during this course:   States the definition of Gestational Diabetes  States why dietary management is important in controlling blood glucose  Describes the effects of carbohydrates on blood glucose levels  Demonstrates ability to create a balanced meal plan  Demonstrates carbohydrate counting   States when to check blood glucose levels  Demonstrates proper blood glucose monitoring techniques  States the effect of stress and exercise on blood glucose levels  States the importance of limiting caffeine and abstaining from alcohol and smoking  Plan:  Aim for 2 Carb Choices per meal (30 grams) +/- 1 either way for breakfast Aim for 3 Carb Choices per meal (45 grams) +/- 1 either way from lunch and dinner Aim for 1-2 Carbs per snack Begin reading food labels for Total Carbohydrate and sugar grams of foods Consider  increasing your activity level by walking daily as tolerated Begin checking BG before breakfast and 1-2 hours after first bit of breakfast, lunch and dinner after  as directed by MD  Take medication  as directed by MD  Blood glucose monitor given: Accu Chek Nano BG Monitoring Kit Lot # [id number] Exp: 03/30/15 Blood glucose reading: 87 mg/dl  Patient instructed to monitor glucose levels: FBS: 60 - <90 2 hour: <120  Patient received the following handouts:  Nutrition Diabetes and Pregnancy  Carbohydrate Counting List  Meal Planning worksheet  Patient will be seen for follow-up as needed.

## 2014-05-10 ENCOUNTER — Encounter: Payer: Medicaid Other | Admitting: Obstetrics & Gynecology

## 2014-05-11 ENCOUNTER — Ambulatory Visit (INDEPENDENT_AMBULATORY_CARE_PROVIDER_SITE_OTHER): Payer: Medicaid Other | Admitting: Family Medicine

## 2014-05-11 ENCOUNTER — Encounter: Payer: Self-pay | Admitting: Family Medicine

## 2014-05-11 VITALS — BP 109/75 | HR 101 | Wt 217.0 lb

## 2014-05-11 DIAGNOSIS — O24919 Unspecified diabetes mellitus in pregnancy, unspecified trimester: Secondary | ICD-10-CM | POA: Insufficient documentation

## 2014-05-11 DIAGNOSIS — Z3490 Encounter for supervision of normal pregnancy, unspecified, unspecified trimester: Secondary | ICD-10-CM

## 2014-05-11 DIAGNOSIS — O99810 Abnormal glucose complicating pregnancy: Secondary | ICD-10-CM

## 2014-05-11 DIAGNOSIS — Z3480 Encounter for supervision of other normal pregnancy, unspecified trimester: Secondary | ICD-10-CM

## 2014-05-11 NOTE — Patient Instructions (Addendum)
Following an appropriate diet and keeping your blood sugar under control is the most important thing to do for your health and that of your unborn baby.  Please check your blood sugar 4 times daily.  Please keep accurate BS logs and bring them with you to every visit.  Please bring your meter also.  Goals for Blood sugar should be: 1. Fasting (first thing in the morning before eating) should be less than 90.   2.  2 hours after meals should be less than 120.  Please eat 3 meals and 3 snacks.  Include protein (meat, dairy-cheese, eggs, nuts) with all meals.  Be mindful that carbohydrates increase your blood sugar.  Not just sweet food (cookies, cake, donuts, fruit, juice, soda) but also bread, pasta, rice, and potatoes.  You have to limit how many carbs you are eating.  Adding exercise, as little as 30 minutes a day can decrease your blood sugar.  Gestational Diabetes Mellitus Gestational diabetes mellitus, often simply referred to as gestational diabetes, is a type of diabetes that some women develop during pregnancy. In gestational diabetes, the pancreas does not make enough insulin (a hormone), the cells are less responsive to the insulin that is made (insulin resistance), or both.Normally, insulin moves sugars from food into the tissue cells. The tissue cells use the sugars for energy. The lack of insulin or the lack of normal response to insulin causes excess sugars to build up in the blood instead of going into the tissue cells. As a result, high blood sugar (hyperglycemia) develops. The effect of high sugar (glucose) levels can cause many complications.  RISK FACTORS You have an increased chance of developing gestational diabetes if you have a family history of diabetes and also have one or more of the following risk factors:  A body mass index over 30 (obesity).  A previous pregnancy with gestational diabetes.  An older age at the time of pregnancy. If blood glucose levels are kept  in the normal range during pregnancy, women can have a healthy pregnancy. If your blood glucose levels are not well controlled, there may be risks to you, your unborn baby (fetus), your labor and delivery, or your newborn baby.  SYMPTOMS  If symptoms are experienced, they are much like symptoms you would normally expect during pregnancy. The symptoms of gestational diabetes include:   Increased thirst (polydipsia).  Increased urination (polyuria).  Increased urination during the night (nocturia).  Weight loss. This weight loss may be rapid.  Frequent, recurring infections.  Tiredness (fatigue).  Weakness.  Vision changes, such as blurred vision.  Fruity smell to your breath.  Abdominal pain. DIAGNOSIS Diabetes is diagnosed when blood glucose levels are increased. Your blood glucose level may be checked by one or more of the following blood tests:  A fasting blood glucose test. You will not be allowed to eat for at least 8 hours before a blood sample is taken.  A random blood glucose test. Your blood glucose is checked at any time of the day regardless of when you ate.  A hemoglobin A1c blood glucose test. A hemoglobin A1c test provides information about blood glucose control over the previous 3 months.  An oral glucose tolerance test (OGTT). Your blood glucose is measured after you have not eaten (fasted) for 1 3 hours and then after you drink a glucose-containing beverage. Since the hormones that cause insulin resistance are highest at about 24 28 weeks of a pregnancy, an OGTT is usually performed during that   time. If you have risk factors for gestational diabetes, your caregiver may test you for gestational diabetes earlier than 24 weeks of pregnancy. TREATMENT   You will need to take diabetes medicine or insulin daily to keep blood glucose levels in the desired range.  You will need to match insulin dosing with exercise and healthy food choices. The treatment goal is to  maintain the before meal (preprandial), bedtime, and overnight blood glucose level at 60 99 mg/dL during pregnancy. The treatment goal is to further maintain peak after meal blood sugar (postprandial glucose) level at 100 140 mg/dL. HOME CARE INSTRUCTIONS   Have your hemoglobin A1c level checked twice a year.  Perform daily blood glucose monitoring as directed by your caregiver. It is common to perform frequent blood glucose monitoring.  Monitor urine ketones when you are ill and as directed by your caregiver.  Take your diabetes medicine and insulin as directed by your caregiver to maintain your blood glucose level in the desired range.  Never run out of diabetes medicine or insulin. It is needed every day.  Adjust insulin based on your intake of carbohydrates. Carbohydrates can raise blood glucose levels but need to be included in your diet. Carbohydrates provide vitamins, minerals, and fiber which are an essential part of a healthy diet. Carbohydrates are found in fruits, vegetables, whole grains, dairy products, legumes, and foods containing added sugars.    Eat healthy foods. Alternate 3 meals with 3 snacks.  Maintain a healthy weight gain. The usual total expected weight gain varies according to your prepregnancy body mass index (BMI).  Carry a medical alert card or wear your medical alert jewelry.  Carry a 15 gram carbohydrate snack with you at all times to treat low blood glucose (hypoglycemia). Some examples of 15 gram carbohydrate snacks include:  Glucose tablets, 3 or 4   Glucose gel, 15 gram tube  Raisins, 2 tablespoons (24 g)  Jelly beans, 6  Animal crackers, 8  Fruit juice, regular soda, or low fat milk, 4 ounces (120 mL)  Gummy treats, 9    Recognize hypoglycemia. Hypoglycemia during pregnancy occurs with blood glucose levels of 60 mg/dL and below. The risk for hypoglycemia increases when fasting or skipping meals, during or after intense exercise, and during  sleep. Hypoglycemia symptoms can include:  Tremors or shakes.  Decreased ability to concentrate.  Sweating.  Increased heart rate.  Headache.  Dry mouth.  Hunger.  Irritability.  Anxiety.  Restless sleep.  Altered speech or coordination.  Confusion.  Treat hypoglycemia promptly. If you are alert and able to safely swallow, follow the 15:15 rule:  Take 15 20 grams of rapid-acting glucose or carbohydrate. Rapid-acting options include glucose gel, glucose tablets, or 4 ounces (120 mL) of fruit juice, regular soda, or low fat milk.  Check your blood glucose level 15 minutes after taking the glucose.   Take 15 20 grams more of glucose if the repeat blood glucose level is still 70 mg/dL or below.  Eat a meal or snack within 1 hour once blood glucose levels return to normal.  Be alert to polyuria and polydipsia which are early signs of hyperglycemia. An early awareness of hyperglycemia allows for prompt treatment. Treat hyperglycemia as directed by your caregiver.  Engage in at least 30 minutes of physical activity a day or as directed by your caregiver. Ten minutes of physical activity timed 30 minutes after each meal is encouraged to control postprandial blood glucose levels.  Adjust your insulin dosing and   food intake as needed if you start a new exercise or sport.  Follow your sick day plan at any time you are unable to eat or drink as usual.  Avoid tobacco and alcohol use.  Follow up with your caregiver regularly.  Follow the advice of your caregiver regarding your prenatal and post-delivery (postpartum) appointments, meal planning, exercise, medicines, vitamins, blood tests, other medical tests, and physical activities.  Perform daily skin and foot care. Examine your skin and feet daily for cuts, bruises, redness, nail problems, bleeding, blisters, or sores.  Brush your teeth and gums at least twice a day and floss at least once a day. Follow up with your dentist  regularly.  Schedule an eye exam during the first trimester of your pregnancy or as directed by your caregiver.  Share your diabetes management plan with your workplace or school.  Stay up-to-date with immunizations.  Learn to manage stress.  Obtain ongoing diabetes education and support as needed. SEEK MEDICAL CARE IF:   You are unable to eat food or drink fluids for more than 6 hours.  You have nausea and vomiting for more than 6 hours.  You have a blood glucose level of 200 mg/dL and you have ketones in your urine.  There is a change in mental status.  You develop vision problems.  You have a persistent headache.  You have upper abdominal pain or discomfort.  You develop an additional serious illness.  You have diarrhea for more than 6 hours.  You have been sick or have had a fever for a couple of days and are not getting better. SEEK IMMEDIATE MEDICAL CARE IF:   You have difficulty breathing.  You no longer feel the baby moving.  You are bleeding or have discharge from your vagina.  You start having premature contractions or labor. MAKE SURE YOU:  Understand these instructions.  Will watch your condition.  Will get help right away if you are not doing well or get worse. Document Released: 03/24/2001 Document Revised: 04/12/2013 Document Reviewed: 07/14/2012 [employer] Patient Information 2014 [employer].  Breastfeeding Deciding to breastfeed is one of the best choices you can make for you and your baby. A change in hormones during pregnancy causes your breast tissue to grow and increases the number and size of your milk ducts. These hormones also allow proteins, sugars, and fats from your blood supply to make breast milk in your milk-producing glands. Hormones prevent breast milk from being released before your baby is born as well as prompt milk flow after birth. Once breastfeeding has begun, thoughts of your baby, as well as his or her sucking or crying,  can stimulate the release of milk from your milk-producing glands.  BENEFITS OF BREASTFEEDING For Your Baby  Your first milk (colostrum) helps your baby's digestive system function better.   There are antibodies in your milk that help your baby fight off infections.   Your baby has a lower incidence of asthma, allergies, and sudden infant death syndrome.   The nutrients in breast milk are better for your baby than infant formulas and are designed uniquely for your baby's needs.   Breast milk improves your baby's brain development.   Your baby is less likely to develop other conditions, such as childhood obesity, asthma, or type 2 diabetes mellitus.  For You   Breastfeeding helps to create a very special bond between you and your baby.   Breastfeeding is convenient. Breast milk is always available at the correct temperature   and costs nothing.   Breastfeeding helps to burn calories and helps you lose the weight gained during pregnancy.   Breastfeeding makes your uterus contract to its prepregnancy size faster and slows bleeding (lochia) after you give birth.   Breastfeeding helps to lower your risk of developing type 2 diabetes mellitus, osteoporosis, and breast or ovarian cancer later in life. SIGNS THAT YOUR BABY IS HUNGRY Early Signs of Hunger  Increased alertness or activity.  Stretching.  Movement of the head from side to side.  Movement of the head and opening of the mouth when the corner of the mouth or cheek is stroked (rooting).  Increased sucking sounds, smacking lips, cooing, sighing, or squeaking.  Hand-to-mouth movements.  Increased sucking of fingers or hands. Late Signs of Hunger  Fussing.  Intermittent crying. Extreme Signs of Hunger Signs of extreme hunger will require calming and consoling before your baby will be able to breastfeed successfully. Do not wait for the following signs of extreme hunger to occur before you initiate breastfeeding:    Restlessness.  A loud, strong cry.   Screaming. BREASTFEEDING BASICS Breastfeeding Initiation  Find a comfortable place to sit or lie down, with your neck and back well supported.  Place a pillow or rolled up blanket under your baby to bring him or her to the level of your breast (if you are seated). Nursing pillows are specially designed to help support your arms and your baby while you breastfeed.  Make sure that your baby's abdomen is facing your abdomen.   Gently massage your breast. With your fingertips, massage from your chest wall toward your nipple in a circular motion. This encourages milk flow. You may need to continue this action during the feeding if your milk flows slowly.  Support your breast with 4 fingers underneath and your thumb above your nipple. Make sure your fingers are well away from your nipple and your baby's mouth.   Stroke your baby's lips gently with your finger or nipple.   When your baby's mouth is open wide enough, quickly bring your baby to your breast, placing your entire nipple and as much of the colored area around your nipple (areola) as possible into your baby's mouth.   More areola should be visible above your baby's upper lip than below the lower lip.   Your baby's tongue should be between his or her lower gum and your breast.   Ensure that your baby's mouth is correctly positioned around your nipple (latched). Your baby's lips should create a seal on your breast and be turned out (everted).  It is common for your baby to suck about 2 3 minutes in order to start the flow of breast milk. Latching Teaching your baby how to latch on to your breast properly is very important. An improper latch can cause nipple pain and decreased milk supply for you and poor weight gain in your baby. Also, if your baby is not latched onto your nipple properly, he or she may swallow some air during feeding. This can make your baby fussy. Burping your baby when  you switch breasts during the feeding can help to get rid of the air. However, teaching your baby to latch on properly is still the best way to prevent fussiness from swallowing air while breastfeeding. Signs that your baby has successfully latched on to your nipple:    Silent tugging or silent sucking, without causing you pain.   Swallowing heard between every 3 4 sucks.      Muscle movement above and in front of his or her ears while sucking.  Signs that your baby has not successfully latched on to nipple:   Sucking sounds or smacking sounds from your baby while breastfeeding.  Nipple pain. If you think your baby has not latched on correctly, slip your finger into the corner of your baby's mouth to break the suction and place it between your baby's gums. Attempt breastfeeding initiation again. Signs of Successful Breastfeeding Signs from your baby:   A gradual decrease in the number of sucks or complete cessation of sucking.   Falling asleep.   Relaxation of his or her body.   Retention of a small amount of milk in his or her mouth.   Letting go of your breast by himself or herself. Signs from you:  Breasts that have increased in firmness, weight, and size 1 3 hours after feeding.   Breasts that are softer immediately after breastfeeding.  Increased milk volume, as well as a change in milk consistency and color by the 5th day of breastfeeding.   Nipples that are not sore, cracked, or bleeding. Signs That Your Baby is Getting Enough Milk  Wetting at least 3 diapers in a 24-hour period. The urine should be clear and pale yellow by age 5 days.  At least 3 stools in a 24-hour period by age 5 days. The stool should be soft and yellow.  At least 3 stools in a 24-hour period by age 7 days. The stool should be seedy and yellow.  No loss of weight greater than 10% of birth weight during the first 3 days of age.  Average weight gain of 4 7 ounces (120 210 mL) per week  after age 4 days.  Consistent daily weight gain by age 5 days, without weight loss after the age of 2 weeks. After a feeding, your baby may spit up a small amount. This is common. BREASTFEEDING FREQUENCY AND DURATION Frequent feeding will help you make more milk and can prevent sore nipples and breast engorgement. Breastfeed when you feel the need to reduce the fullness of your breasts or when your baby shows signs of hunger. This is called "breastfeeding on demand." Avoid introducing a pacifier to your baby while you are working to establish breastfeeding (the first 4 6 weeks after your baby is born). After this time you may choose to use a pacifier. Research has shown that pacifier use during the first year of a baby's life decreases the risk of sudden infant death syndrome (SIDS). Allow your baby to feed on each breast as long as he or she wants. Breastfeed until your baby is finished feeding. When your baby unlatches or falls asleep while feeding from the first breast, offer the second breast. Because newborns are often sleepy in the first few weeks of life, you may need to awaken your baby to get him or her to feed. Breastfeeding times will vary from baby to baby. However, the following rules can serve as a guide to help you ensure that your baby is properly fed:  Newborns (babies 4 weeks of age or younger) may breastfeed every 1 3 hours.  Newborns should not go longer than 3 hours during the day or 5 hours during the night without breastfeeding.  You should breastfeed your baby a minimum of 8 times in a 24-hour period until you begin to introduce solid foods to your baby at around 6 months of age. BREAST MILK PUMPING Pumping and storing breast milk   allows you to ensure that your baby is exclusively fed your breast milk, even at times when you are unable to breastfeed. This is especially important if you are going back to work while you are still breastfeeding or when you are not able to be  present during feedings. Your lactation consultant can give you guidelines on how long it is safe to store breast milk.  A breast pump is a machine that allows you to pump milk from your breast into a sterile bottle. The pumped breast milk can then be stored in a refrigerator or freezer. Some breast pumps are operated by hand, while others use electricity. Ask your lactation consultant which type will work best for you. Breast pumps can be purchased, but some hospitals and breastfeeding support groups lease breast pumps on a monthly basis. A lactation consultant can teach you how to hand express breast milk, if you prefer not to use a pump.  CARING FOR YOUR BREASTS WHILE YOU BREASTFEED Nipples can become dry, cracked, and sore while breastfeeding. The following recommendations can help keep your breasts moisturized and healthy:  Avoid using soap on your nipples.   Wear a supportive bra. Although not required, special nursing bras and tank tops are designed to allow access to your breasts for breastfeeding without taking off your entire bra or top. Avoid wearing underwire style bras or extremely tight bras.  Air dry your nipples for 3 4minutes after each feeding.   Use only cotton bra pads to absorb leaked breast milk. Leaking of breast milk between feedings is normal.   Use lanolin on your nipples after breastfeeding. Lanolin helps to maintain your skin's normal moisture barrier. If you use pure lanolin you do not need to wash it off before feeding your baby again. Pure lanolin is not toxic to your baby. You may also hand express a few drops of breast milk and gently massage that milk into your nipples and allow the milk to air dry. In the first few weeks after giving birth, some women experience extremely full breasts (engorgement). Engorgement can make your breasts feel heavy, warm, and tender to the touch. Engorgement peaks within 3 5 days after you give birth. The following recommendations can  help ease engorgement:  Completely empty your breasts while breastfeeding or pumping. You may want to start by applying warm, moist heat (in the shower or with warm water-soaked hand towels) just before feeding or pumping. This increases circulation and helps the milk flow. If your baby does not completely empty your breasts while breastfeeding, pump any extra milk after he or she is finished.  Wear a snug bra (nursing or regular) or tank top for 1 2 days to signal your body to slightly decrease milk production.  Apply ice packs to your breasts, unless this is too uncomfortable for you.  Make sure that your baby is latched on and positioned properly while breastfeeding. If engorgement persists after 48 hours of following these recommendations, contact your health care provider or a lactation consultant. OVERALL HEALTH CARE RECOMMENDATIONS WHILE BREASTFEEDING  Eat healthy foods. Alternate between meals and snacks, eating 3 of each per day. Because what you eat affects your breast milk, some of the foods may make your baby more irritable than usual. Avoid eating these foods if you are sure that they are negatively affecting your baby.  Drink milk, fruit juice, and water to satisfy your thirst (about 10 glasses a day).   Rest often, relax, and continue to take your   prenatal vitamins to prevent fatigue, stress, and anemia.  Continue breast self-awareness checks.  Avoid chewing and smoking tobacco.  Avoid alcohol and drug use. Some medicines that may be harmful to your baby can pass through breast milk. It is important to ask your health care provider before taking any medicine, including all over-the-counter and prescription medicine as well as vitamin and herbal supplements. It is possible to become pregnant while breastfeeding. If birth control is desired, ask your health care provider about options that will be safe for your baby. SEEK MEDICAL CARE IF:   You feel like you want to stop  breastfeeding or have become frustrated with breastfeeding.  You have painful breasts or nipples.  Your nipples are cracked or bleeding.  Your breasts are red, tender, or warm.  You have a swollen area on either breast.  You have a fever or chills.  You have nausea or vomiting.  You have drainage other than breast milk from your nipples.  Your breasts do not become full before feedings by the 5th day after you give birth.  You feel sad and depressed.  Your baby is too sleepy to eat well.  Your baby is having trouble sleeping.   Your baby is wetting less than 3 diapers in a 24-hour period.  Your baby has less than 3 stools in a 24-hour period.  Your baby's skin or the white part of his or her eyes becomes yellow.   Your baby is not gaining weight by 5 days of age. SEEK IMMEDIATE MEDICAL CARE IF:   Your baby is overly tired (lethargic) and does not want to wake up and feed.  Your baby develops an unexplained fever. Document Released: 12/16/2005 Document Revised: 08/18/2013 Document Reviewed: 06/09/2013 [employer] Patient Information 2014 [employer].  

## 2014-05-11 NOTE — Progress Notes (Signed)
New diagnosis of GDM--discussed diet and impact on pregnancy--future implications--pp testing FBS 83-97 1/2 out of range but mostly low 90's. 2 hour pp 93-124 2 out of range Add exercise

## 2014-05-25 ENCOUNTER — Ambulatory Visit (INDEPENDENT_AMBULATORY_CARE_PROVIDER_SITE_OTHER): Payer: Medicaid Other | Admitting: Obstetrics & Gynecology

## 2014-05-25 VITALS — BP 109/87 | HR 106 | Wt 218.0 lb

## 2014-05-25 DIAGNOSIS — E119 Type 2 diabetes mellitus without complications: Secondary | ICD-10-CM

## 2014-05-25 DIAGNOSIS — O24919 Unspecified diabetes mellitus in pregnancy, unspecified trimester: Secondary | ICD-10-CM

## 2014-05-25 DIAGNOSIS — Z3480 Encounter for supervision of other normal pregnancy, unspecified trimester: Secondary | ICD-10-CM

## 2014-05-25 NOTE — Patient Instructions (Signed)
Return to clinic for any obstetric concerns or go to [facility] for evaluation  

## 2014-05-25 NOTE — Progress Notes (Signed)
Blood sugars are within range, continue diet control and exercise. Patient reports stepping on honey bee and has swollen right foot and itching. No erythema on exam. Advised antihistamines, hydrocortisone cream for now. Will continue to monitor. No other complaints or concerns.  Fetal movement and labor precautions reviewed.

## 2014-06-09 ENCOUNTER — Ambulatory Visit (INDEPENDENT_AMBULATORY_CARE_PROVIDER_SITE_OTHER): Payer: Medicaid Other | Admitting: Family Medicine

## 2014-06-09 VITALS — BP 112/95 | HR 107 | Wt 220.0 lb

## 2014-06-09 DIAGNOSIS — O24919 Unspecified diabetes mellitus in pregnancy, unspecified trimester: Secondary | ICD-10-CM

## 2014-06-09 DIAGNOSIS — Z3480 Encounter for supervision of other normal pregnancy, unspecified trimester: Secondary | ICD-10-CM

## 2014-06-09 DIAGNOSIS — E669 Obesity, unspecified: Secondary | ICD-10-CM

## 2014-06-09 DIAGNOSIS — Z3490 Encounter for supervision of normal pregnancy, unspecified, unspecified trimester: Secondary | ICD-10-CM

## 2014-06-09 DIAGNOSIS — O99210 Obesity complicating pregnancy, unspecified trimester: Secondary | ICD-10-CM

## 2014-06-09 DIAGNOSIS — E119 Type 2 diabetes mellitus without complications: Secondary | ICD-10-CM

## 2014-06-09 NOTE — Patient Instructions (Signed)
Gestational Diabetes Mellitus Gestational diabetes mellitus, often simply referred to as gestational diabetes, is a type of diabetes that some women develop during pregnancy. In gestational diabetes, the pancreas does not make enough insulin (a hormone), the cells are less responsive to the insulin that is made (insulin resistance), or both.Normally, insulin moves sugars from food into the tissue cells. The tissue cells use the sugars for energy. The lack of insulin or the lack of normal response to insulin causes excess sugars to build up in the blood instead of going into the tissue cells. As a result, high blood sugar (hyperglycemia) develops. The effect of high sugar (glucose) levels can cause many complications.  RISK FACTORS You have an increased chance of developing gestational diabetes if you have a family history of diabetes and also have one or more of the following risk factors:  A body mass index over 30 (obesity).  A previous pregnancy with gestational diabetes.  An older age at the time of pregnancy. If blood glucose levels are kept in the normal range during pregnancy, women can have a healthy pregnancy. If your blood glucose levels are not well controlled, there may be risks to you, your unborn baby (fetus), your labor and delivery, or your newborn baby.  SYMPTOMS  If symptoms are experienced, they are much like symptoms you would normally expect during pregnancy. The symptoms of gestational diabetes include:   Increased thirst (polydipsia).  Increased urination (polyuria).  Increased urination during the night (nocturia).  Weight loss. This weight loss may be rapid.  Frequent, recurring infections.  Tiredness (fatigue).  Weakness.  Vision changes, such as blurred vision.  Fruity smell to your breath.  Abdominal pain. DIAGNOSIS Diabetes is diagnosed when blood glucose levels are increased. Your blood glucose level may be checked by one or more of the following  blood tests:  A fasting blood glucose test. You will not be allowed to eat for at least 8 hours before a blood sample is taken.  A random blood glucose test. Your blood glucose is checked at any time of the day regardless of when you ate.  A hemoglobin A1c blood glucose test. A hemoglobin A1c test provides information about blood glucose control over the previous 3 months.  An oral glucose tolerance test (OGTT). Your blood glucose is measured after you have not eaten (fasted) for 1 3 hours and then after you drink a glucose-containing beverage. Since the hormones that cause insulin resistance are highest at about 24 28 weeks of a pregnancy, an OGTT is usually performed during that time. If you have risk factors for gestational diabetes, your caregiver may test you for gestational diabetes earlier than 24 weeks of pregnancy. TREATMENT   You will need to take diabetes medicine or insulin daily to keep blood glucose levels in the desired range.  You will need to match insulin dosing with exercise and healthy food choices. The treatment goal is to maintain the before meal (preprandial), bedtime, and overnight blood glucose level at 60 99 mg/dL during pregnancy. The treatment goal is to further maintain peak after meal blood sugar (postprandial glucose) level at 100 140 mg/dL. HOME CARE INSTRUCTIONS   Have your hemoglobin A1c level checked twice a year.  Perform daily blood glucose monitoring as directed by your caregiver. It is common to perform frequent blood glucose monitoring.  Monitor urine ketones when you are ill and as directed by your caregiver.  Take your diabetes medicine and insulin as directed by your caregiver to maintain   your blood glucose level in the desired range.  Never run out of diabetes medicine or insulin. It is needed every day.  Adjust insulin based on your intake of carbohydrates. Carbohydrates can raise blood glucose levels but need to be included in your diet.  Carbohydrates provide vitamins, minerals, and fiber which are an essential part of a healthy diet. Carbohydrates are found in fruits, vegetables, whole grains, dairy products, legumes, and foods containing added sugars.    Eat healthy foods. Alternate 3 meals with 3 snacks.  Maintain a healthy weight gain. The usual total expected weight gain varies according to your prepregnancy body mass index (BMI).  Carry a medical alert card or wear your medical alert jewelry.  Carry a 15 gram carbohydrate snack with you at all times to treat low blood glucose (hypoglycemia). Some examples of 15 gram carbohydrate snacks include:  Glucose tablets, 3 or 4   Glucose gel, 15 gram tube  Raisins, 2 tablespoons (24 g)  Jelly beans, 6  Animal crackers, 8  Fruit juice, regular soda, or low fat milk, 4 ounces (120 mL)  Gummy treats, 9    Recognize hypoglycemia. Hypoglycemia during pregnancy occurs with blood glucose levels of 60 mg/dL and below. The risk for hypoglycemia increases when fasting or skipping meals, during or after intense exercise, and during sleep. Hypoglycemia symptoms can include:  Tremors or shakes.  Decreased ability to concentrate.  Sweating.  Increased heart rate.  Headache.  Dry mouth.  Hunger.  Irritability.  Anxiety.  Restless sleep.  Altered speech or coordination.  Confusion.  Treat hypoglycemia promptly. If you are alert and able to safely swallow, follow the 15:15 rule:  Take 15 20 grams of rapid-acting glucose or carbohydrate. Rapid-acting options include glucose gel, glucose tablets, or 4 ounces (120 mL) of fruit juice, regular soda, or low fat milk.  Check your blood glucose level 15 minutes after taking the glucose.   Take 15 20 grams more of glucose if the repeat blood glucose level is still 70 mg/dL or below.  Eat a meal or snack within 1 hour once blood glucose levels return to normal.  Be alert to polyuria and polydipsia which are early  signs of hyperglycemia. An early awareness of hyperglycemia allows for prompt treatment. Treat hyperglycemia as directed by your caregiver.  Engage in at least 30 minutes of physical activity a day or as directed by your caregiver. Ten minutes of physical activity timed 30 minutes after each meal is encouraged to control postprandial blood glucose levels.  Adjust your insulin dosing and food intake as needed if you start a new exercise or sport.  Follow your sick day plan at any time you are unable to eat or drink as usual.  Avoid tobacco and alcohol use.  Follow up with your caregiver regularly.  Follow the advice of your caregiver regarding your prenatal and post-delivery (postpartum) appointments, meal planning, exercise, medicines, vitamins, blood tests, other medical tests, and physical activities.  Perform daily skin and foot care. Examine your skin and feet daily for cuts, bruises, redness, nail problems, bleeding, blisters, or sores.  Brush your teeth and gums at least twice a day and floss at least once a day. Follow up with your dentist regularly.  Schedule an eye exam during the first trimester of your pregnancy or as directed by your caregiver.  Share your diabetes management plan with your workplace or school.  Stay up-to-date with immunizations.  Learn to manage stress.  Obtain ongoing diabetes education and   support as needed. SEEK MEDICAL CARE IF:   You are unable to eat food or drink fluids for more than 6 hours.  You have nausea and vomiting for more than 6 hours.  You have a blood glucose level of 200 mg/dL and you have ketones in your urine.  There is a change in mental status.  You develop vision problems.  You have a persistent headache.  You have upper abdominal pain or discomfort.  You develop an additional serious illness.  You have diarrhea for more than 6 hours.  You have been sick or have had a fever for a couple of days and are not getting  better. SEEK IMMEDIATE MEDICAL CARE IF:   You have difficulty breathing.  You no longer feel the baby moving.  You are bleeding or have discharge from your vagina.  You start having premature contractions or labor. MAKE SURE YOU:  Understand these instructions.  Will watch your condition.  Will get help right away if you are not doing well or get worse. Document Released: 03/24/2001 Document Revised: 04/12/2013 Document Reviewed: 07/14/2012 [employer] Patient Information 2014 [employer].  Third Trimester of Pregnancy The third trimester is from week 29 through week 42, months 7 through 9. The third trimester is a time when the fetus is growing rapidly. At the end of the ninth month, the fetus is about 20 inches in length and weighs 6 10 pounds.  BODY CHANGES Your body goes through many changes during pregnancy. The changes vary from woman to woman.   Your weight will continue to increase. You can expect to gain 25 35 pounds (11 16 kg) by the end of the pregnancy.  You may begin to get stretch marks on your hips, abdomen, and breasts.  You may urinate more often because the fetus is moving lower into your pelvis and pressing on your bladder.  You may develop or continue to have heartburn as a result of your pregnancy.  You may develop constipation because certain hormones are causing the muscles that push waste through your intestines to slow down.  You may develop hemorrhoids or swollen, bulging veins (varicose veins).  You may have pelvic pain because of the weight gain and pregnancy hormones relaxing your joints between the bones in your pelvis. Back aches may result from over exertion of the muscles supporting your posture.  Your breasts will continue to grow and be tender. A yellow discharge may leak from your breasts called colostrum.  Your belly button may stick out.  You may feel short of breath because of your expanding uterus.  You may notice the fetus  "dropping," or moving lower in your abdomen.  You may have a bloody mucus discharge. This usually occurs a few days to a week before labor begins.  Your cervix becomes thin and soft (effaced) near your due date. WHAT TO EXPECT AT YOUR PRENATAL EXAMS  You will have prenatal exams every 2 weeks until week 36. Then, you will have weekly prenatal exams. During a routine prenatal visit:  You will be weighed to make sure you and the fetus are growing normally.  Your blood pressure is taken.  Your abdomen will be measured to track your baby's growth.  The fetal heartbeat will be listened to.  Any test results from the previous visit will be discussed.  You may have a cervical check near your due date to see if you have effaced. At around 36 weeks, your caregiver will check your cervix. At   the same time, your caregiver will also perform a test on the secretions of the vaginal tissue. This test is to determine if a type of bacteria, Group B streptococcus, is present. Your caregiver will explain this further. Your caregiver may ask you:  What your birth plan is.  How you are feeling.  If you are feeling the baby move.  If you have had any abnormal symptoms, such as leaking fluid, bleeding, severe headaches, or abdominal cramping.  If you have any questions. Other tests or screenings that may be performed during your third trimester include:  Blood tests that check for low iron levels (anemia).  Fetal testing to check the health, activity level, and growth of the fetus. Testing is done if you have certain medical conditions or if there are problems during the pregnancy. FALSE LABOR You may feel small, irregular contractions that eventually go away. These are called Braxton Hicks contractions, or false labor. Contractions may last for hours, days, or even weeks before true labor sets in. If contractions come at regular intervals, intensify, or become painful, it is best to be seen by your  caregiver.  SIGNS OF LABOR   Menstrual-like cramps.  Contractions that are 5 minutes apart or less.  Contractions that start on the top of the uterus and spread down to the lower abdomen and back.  A sense of increased pelvic pressure or back pain.  A watery or bloody mucus discharge that comes from the vagina. If you have any of these signs before the 37th week of pregnancy, call your caregiver right away. You need to go to the hospital to get checked immediately. HOME CARE INSTRUCTIONS   Avoid all smoking, herbs, alcohol, and unprescribed drugs. These chemicals affect the formation and growth of the baby.  Follow your caregiver's instructions regarding medicine use. There are medicines that are either safe or unsafe to take during pregnancy.  Exercise only as directed by your caregiver. Experiencing uterine cramps is a good sign to stop exercising.  Continue to eat regular, healthy meals.  Wear a good support bra for breast tenderness.  Do not use hot tubs, steam rooms, or saunas.  Wear your seat belt at all times when driving.  Avoid raw meat, uncooked cheese, cat litter boxes, and soil used by cats. These carry germs that can cause birth defects in the baby.  Take your prenatal vitamins.  Try taking a stool softener (if your caregiver approves) if you develop constipation. Eat more high-fiber foods, such as fresh vegetables or fruit and whole grains. Drink plenty of fluids to keep your urine clear or pale yellow.  Take warm sitz baths to soothe any pain or discomfort caused by hemorrhoids. Use hemorrhoid cream if your caregiver approves.  If you develop varicose veins, wear support hose. Elevate your feet for 15 minutes, 3 4 times a day. Limit salt in your diet.  Avoid heavy lifting, wear low heal shoes, and practice good posture.  Rest a lot with your legs elevated if you have leg cramps or low back pain.  Visit your dentist if you have not gone during your pregnancy.  Use a soft toothbrush to brush your teeth and be gentle when you floss.  A sexual relationship may be continued unless your caregiver directs you otherwise.  Do not travel far distances unless it is absolutely necessary and only with the approval of your caregiver.  Take prenatal classes to understand, practice, and ask questions about the labor and delivery.  Make   a trial run to the hospital.  Pack your hospital bag.  Prepare the baby's nursery.  Continue to go to all your prenatal visits as directed by your caregiver. SEEK MEDICAL CARE IF:  You are unsure if you are in labor or if your water has broken.  You have dizziness.  You have mild pelvic cramps, pelvic pressure, or nagging pain in your abdominal area.  You have persistent nausea, vomiting, or diarrhea.  You have a bad smelling vaginal discharge.  You have pain with urination. SEEK IMMEDIATE MEDICAL CARE IF:   You have a fever.  You are leaking fluid from your vagina.  You have spotting or bleeding from your vagina.  You have severe abdominal cramping or pain.  You have rapid weight loss or gain.  You have shortness of breath with chest pain.  You notice sudden or extreme swelling of your face, hands, ankles, feet, or legs.  You have not felt your baby move in over an hour.  You have severe headaches that do not go away with medicine.  You have vision changes. Document Released: 12/10/2001 Document Revised: 08/18/2013 Document Reviewed: 02/16/2013 [employer] Patient Information 2014 [employer].  Breastfeeding Deciding to breastfeed is one of the best choices you can make for you and your baby. A change in hormones during pregnancy causes your breast tissue to grow and increases the number and size of your milk ducts. These hormones also allow proteins, sugars, and fats from your blood supply to make breast milk in your milk-producing glands. Hormones prevent breast milk from being released before your  baby is born as well as prompt milk flow after birth. Once breastfeeding has begun, thoughts of your baby, as well as his or her sucking or crying, can stimulate the release of milk from your milk-producing glands.  BENEFITS OF BREASTFEEDING For Your Baby  Your first milk (colostrum) helps your baby's digestive system function better.   There are antibodies in your milk that help your baby fight off infections.   Your baby has a lower incidence of asthma, allergies, and sudden infant death syndrome.   The nutrients in breast milk are better for your baby than infant formulas and are designed uniquely for your baby's needs.   Breast milk improves your baby's brain development.   Your baby is less likely to develop other conditions, such as childhood obesity, asthma, or type 2 diabetes mellitus.  For You   Breastfeeding helps to create a very special bond between you and your baby.   Breastfeeding is convenient. Breast milk is always available at the correct temperature and costs nothing.   Breastfeeding helps to burn calories and helps you lose the weight gained during pregnancy.   Breastfeeding makes your uterus contract to its prepregnancy size faster and slows bleeding (lochia) after you give birth.   Breastfeeding helps to lower your risk of developing type 2 diabetes mellitus, osteoporosis, and breast or ovarian cancer later in life. SIGNS THAT YOUR BABY IS HUNGRY Early Signs of Hunger  Increased alertness or activity.  Stretching.  Movement of the head from side to side.  Movement of the head and opening of the mouth when the corner of the mouth or cheek is stroked (rooting).  Increased sucking sounds, smacking lips, cooing, sighing, or squeaking.  Hand-to-mouth movements.  Increased sucking of fingers or hands. Late Signs of Hunger  Fussing.  Intermittent crying. Extreme Signs of Hunger Signs of extreme hunger will require calming and consoling before  your   baby will be able to breastfeed successfully. Do not wait for the following signs of extreme hunger to occur before you initiate breastfeeding:   Restlessness.  A loud, strong cry.   Screaming. BREASTFEEDING BASICS Breastfeeding Initiation  Find a comfortable place to sit or lie down, with your neck and back well supported.  Place a pillow or rolled up blanket under your baby to bring him or her to the level of your breast (if you are seated). Nursing pillows are specially designed to help support your arms and your baby while you breastfeed.  Make sure that your baby's abdomen is facing your abdomen.   Gently massage your breast. With your fingertips, massage from your chest wall toward your nipple in a circular motion. This encourages milk flow. You may need to continue this action during the feeding if your milk flows slowly.  Support your breast with 4 fingers underneath and your thumb above your nipple. Make sure your fingers are well away from your nipple and your baby's mouth.   Stroke your baby's lips gently with your finger or nipple.   When your baby's mouth is open wide enough, quickly bring your baby to your breast, placing your entire nipple and as much of the colored area around your nipple (areola) as possible into your baby's mouth.   More areola should be visible above your baby's upper lip than below the lower lip.   Your baby's tongue should be between his or her lower gum and your breast.   Ensure that your baby's mouth is correctly positioned around your nipple (latched). Your baby's lips should create a seal on your breast and be turned out (everted).  It is common for your baby to suck about 2 3 minutes in order to start the flow of breast milk. Latching Teaching your baby how to latch on to your breast properly is very important. An improper latch can cause nipple pain and decreased milk supply for you and poor weight gain in your baby. Also, if  your baby is not latched onto your nipple properly, he or she may swallow some air during feeding. This can make your baby fussy. Burping your baby when you switch breasts during the feeding can help to get rid of the air. However, teaching your baby to latch on properly is still the best way to prevent fussiness from swallowing air while breastfeeding. Signs that your baby has successfully latched on to your nipple:    Silent tugging or silent sucking, without causing you pain.   Swallowing heard between every 3 4 sucks.    Muscle movement above and in front of his or her ears while sucking.  Signs that your baby has not successfully latched on to nipple:   Sucking sounds or smacking sounds from your baby while breastfeeding.  Nipple pain. If you think your baby has not latched on correctly, slip your finger into the corner of your baby's mouth to break the suction and place it between your baby's gums. Attempt breastfeeding initiation again. Signs of Successful Breastfeeding Signs from your baby:   A gradual decrease in the number of sucks or complete cessation of sucking.   Falling asleep.   Relaxation of his or her body.   Retention of a small amount of milk in his or her mouth.   Letting go of your breast by himself or herself. Signs from you:  Breasts that have increased in firmness, weight, and size 1 3 hours after feeding.     Breasts that are softer immediately after breastfeeding.  Increased milk volume, as well as a change in milk consistency and color by the 5th day of breastfeeding.   Nipples that are not sore, cracked, or bleeding. Signs That Your Baby is Getting Enough Milk  Wetting at least 3 diapers in a 24-hour period. The urine should be clear and pale yellow by age 5 days.  At least 3 stools in a 24-hour period by age 5 days. The stool should be soft and yellow.  At least 3 stools in a 24-hour period by age 7 days. The stool should be seedy and  yellow.  No loss of weight greater than 10% of birth weight during the first 3 days of age.  Average weight gain of 4 7 ounces (120 210 mL) per week after age 4 days.  Consistent daily weight gain by age 5 days, without weight loss after the age of 2 weeks. After a feeding, your baby may spit up a small amount. This is common. BREASTFEEDING FREQUENCY AND DURATION Frequent feeding will help you make more milk and can prevent sore nipples and breast engorgement. Breastfeed when you feel the need to reduce the fullness of your breasts or when your baby shows signs of hunger. This is called "breastfeeding on demand." Avoid introducing a pacifier to your baby while you are working to establish breastfeeding (the first 4 6 weeks after your baby is born). After this time you may choose to use a pacifier. Research has shown that pacifier use during the first year of a baby's life decreases the risk of sudden infant death syndrome (SIDS). Allow your baby to feed on each breast as long as he or she wants. Breastfeed until your baby is finished feeding. When your baby unlatches or falls asleep while feeding from the first breast, offer the second breast. Because newborns are often sleepy in the first few weeks of life, you may need to awaken your baby to get him or her to feed. Breastfeeding times will vary from baby to baby. However, the following rules can serve as a guide to help you ensure that your baby is properly fed:  Newborns (babies 4 weeks of age or younger) may breastfeed every 1 3 hours.  Newborns should not go longer than 3 hours during the day or 5 hours during the night without breastfeeding.  You should breastfeed your baby a minimum of 8 times in a 24-hour period until you begin to introduce solid foods to your baby at around 6 months of age. BREAST MILK PUMPING Pumping and storing breast milk allows you to ensure that your baby is exclusively fed your breast milk, even at times when you  are unable to breastfeed. This is especially important if you are going back to work while you are still breastfeeding or when you are not able to be present during feedings. Your lactation consultant can give you guidelines on how long it is safe to store breast milk.  A breast pump is a machine that allows you to pump milk from your breast into a sterile bottle. The pumped breast milk can then be stored in a refrigerator or freezer. Some breast pumps are operated by hand, while others use electricity. Ask your lactation consultant which type will work best for you. Breast pumps can be purchased, but some hospitals and breastfeeding support groups lease breast pumps on a monthly basis. A lactation consultant can teach you how to hand express breast milk, if you   prefer not to use a pump.  CARING FOR YOUR BREASTS WHILE YOU BREASTFEED Nipples can become dry, cracked, and sore while breastfeeding. The following recommendations can help keep your breasts moisturized and healthy:  Avoid using soap on your nipples.   Wear a supportive bra. Although not required, special nursing bras and tank tops are designed to allow access to your breasts for breastfeeding without taking off your entire bra or top. Avoid wearing underwire style bras or extremely tight bras.  Air dry your nipples for 3 4minutes after each feeding.   Use only cotton bra pads to absorb leaked breast milk. Leaking of breast milk between feedings is normal.   Use lanolin on your nipples after breastfeeding. Lanolin helps to maintain your skin's normal moisture barrier. If you use pure lanolin you do not need to wash it off before feeding your baby again. Pure lanolin is not toxic to your baby. You may also hand express a few drops of breast milk and gently massage that milk into your nipples and allow the milk to air dry. In the first few weeks after giving birth, some women experience extremely full breasts (engorgement). Engorgement can  make your breasts feel heavy, warm, and tender to the touch. Engorgement peaks within 3 5 days after you give birth. The following recommendations can help ease engorgement:  Completely empty your breasts while breastfeeding or pumping. You may want to start by applying warm, moist heat (in the shower or with warm water-soaked hand towels) just before feeding or pumping. This increases circulation and helps the milk flow. If your baby does not completely empty your breasts while breastfeeding, pump any extra milk after he or she is finished.  Wear a snug bra (nursing or regular) or tank top for 1 2 days to signal your body to slightly decrease milk production.  Apply ice packs to your breasts, unless this is too uncomfortable for you.  Make sure that your baby is latched on and positioned properly while breastfeeding. If engorgement persists after 48 hours of following these recommendations, contact your health care provider or a lactation consultant. OVERALL HEALTH CARE RECOMMENDATIONS WHILE BREASTFEEDING  Eat healthy foods. Alternate between meals and snacks, eating 3 of each per day. Because what you eat affects your breast milk, some of the foods may make your baby more irritable than usual. Avoid eating these foods if you are sure that they are negatively affecting your baby.  Drink milk, fruit juice, and water to satisfy your thirst (about 10 glasses a day).   Rest often, relax, and continue to take your prenatal vitamins to prevent fatigue, stress, and anemia.  Continue breast self-awareness checks.  Avoid chewing and smoking tobacco.  Avoid alcohol and drug use. Some medicines that may be harmful to your baby can pass through breast milk. It is important to ask your health care provider before taking any medicine, including all over-the-counter and prescription medicine as well as vitamin and herbal supplements. It is possible to become pregnant while breastfeeding. If birth control  is desired, ask your health care provider about options that will be safe for your baby. SEEK MEDICAL CARE IF:   You feel like you want to stop breastfeeding or have become frustrated with breastfeeding.  You have painful breasts or nipples.  Your nipples are cracked or bleeding.  Your breasts are red, tender, or warm.  You have a swollen area on either breast.  You have a fever or chills.  You have nausea   or vomiting.  You have drainage other than breast milk from your nipples.  Your breasts do not become full before feedings by the 5th day after you give birth.  You feel sad and depressed.  Your baby is too sleepy to eat well.  Your baby is having trouble sleeping.   Your baby is wetting less than 3 diapers in a 24-hour period.  Your baby has less than 3 stools in a 24-hour period.  Your baby's skin or the white part of his or her eyes becomes yellow.   Your baby is not gaining weight by 5 days of age. SEEK IMMEDIATE MEDICAL CARE IF:   Your baby is overly tired (lethargic) and does not want to wake up and feed.  Your baby develops an unexplained fever. Document Released: 12/16/2005 Document Revised: 08/18/2013 Document Reviewed: 06/09/2013 [employer] Patient Information 2014 [employer].  

## 2014-06-09 NOTE — Progress Notes (Signed)
FBS 81-97 ( 3 of 14 out of range) 2 hr pp 88-127 (3 of 42 out of range) Doing well--BS in good control.

## 2014-06-14 ENCOUNTER — Ambulatory Visit (INDEPENDENT_AMBULATORY_CARE_PROVIDER_SITE_OTHER): Payer: Medicaid Other | Admitting: Family Medicine

## 2014-06-14 ENCOUNTER — Encounter: Payer: Self-pay | Admitting: Family Medicine

## 2014-06-14 VITALS — BP 125/99 | HR 94 | Wt 219.8 lb

## 2014-06-14 DIAGNOSIS — E119 Type 2 diabetes mellitus without complications: Secondary | ICD-10-CM

## 2014-06-14 DIAGNOSIS — O24919 Unspecified diabetes mellitus in pregnancy, unspecified trimester: Secondary | ICD-10-CM

## 2014-06-14 DIAGNOSIS — Z3490 Encounter for supervision of normal pregnancy, unspecified, unspecified trimester: Secondary | ICD-10-CM

## 2014-06-14 DIAGNOSIS — Z3480 Encounter for supervision of other normal pregnancy, unspecified trimester: Secondary | ICD-10-CM

## 2014-06-14 MED ORDER — GLYBURIDE 2.5 MG PO TABS: 2.5000 mg | ORAL_TABLET | Freq: Every day | ORAL | Status: DC

## 2014-06-14 NOTE — Progress Notes (Signed)
Patient is concerned her blood sugars are not doing as well as she would like.  She is not having headaches or blurry vision.  She does complain of increased fatigue.

## 2014-06-14 NOTE — Patient Instructions (Signed)
Gestational Diabetes Mellitus Gestational diabetes mellitus, often simply referred to as gestational diabetes, is a type of diabetes that some women develop during pregnancy. In gestational diabetes, the pancreas does not make enough insulin (a hormone), the cells are less responsive to the insulin that is made (insulin resistance), or both.Normally, insulin moves sugars from food into the tissue cells. The tissue cells use the sugars for energy. The lack of insulin or the lack of normal response to insulin causes excess sugars to build up in the blood instead of going into the tissue cells. As a result, high blood sugar (hyperglycemia) develops. The effect of high sugar (glucose) levels can cause many complications.  RISK FACTORS You have an increased chance of developing gestational diabetes if you have a family history of diabetes and also have one or more of the following risk factors:  A body mass index over 30 (obesity).  A previous pregnancy with gestational diabetes.  An older age at the time of pregnancy. If blood glucose levels are kept in the normal range during pregnancy, women can have a healthy pregnancy. If your blood glucose levels are not well controlled, there may be risks to you, your unborn baby (fetus), your labor and delivery, or your newborn baby.  SYMPTOMS  If symptoms are experienced, they are much like symptoms you would normally expect during pregnancy. The symptoms of gestational diabetes include:   Increased thirst (polydipsia).  Increased urination (polyuria).  Increased urination during the night (nocturia).  Weight loss. This weight loss may be rapid.  Frequent, recurring infections.  Tiredness (fatigue).  Weakness.  Vision changes, such as blurred vision.  Fruity smell to your breath.  Abdominal pain. DIAGNOSIS Diabetes is diagnosed when blood glucose levels are increased. Your blood glucose level may be checked by one or more of the following  blood tests:  A fasting blood glucose test. You will not be allowed to eat for at least 8 hours before a blood sample is taken.  A random blood glucose test. Your blood glucose is checked at any time of the day regardless of when you ate.  A hemoglobin A1c blood glucose test. A hemoglobin A1c test provides information about blood glucose control over the previous 3 months.  An oral glucose tolerance test (OGTT). Your blood glucose is measured after you have not eaten (fasted) for 1 3 hours and then after you drink a glucose-containing beverage. Since the hormones that cause insulin resistance are highest at about 24 28 weeks of a pregnancy, an OGTT is usually performed during that time. If you have risk factors for gestational diabetes, your caregiver may test you for gestational diabetes earlier than 24 weeks of pregnancy. TREATMENT   You will need to take diabetes medicine or insulin daily to keep blood glucose levels in the desired range.  You will need to match insulin dosing with exercise and healthy food choices. The treatment goal is to maintain the before meal (preprandial), bedtime, and overnight blood glucose level at 60 99 mg/dL during pregnancy. The treatment goal is to further maintain peak after meal blood sugar (postprandial glucose) level at 100 140 mg/dL. HOME CARE INSTRUCTIONS   Have your hemoglobin A1c level checked twice a year.  Perform daily blood glucose monitoring as directed by your caregiver. It is common to perform frequent blood glucose monitoring.  Monitor urine ketones when you are ill and as directed by your caregiver.  Take your diabetes medicine and insulin as directed by your caregiver to maintain   your blood glucose level in the desired range.  Never run out of diabetes medicine or insulin. It is needed every day.  Adjust insulin based on your intake of carbohydrates. Carbohydrates can raise blood glucose levels but need to be included in your diet.  Carbohydrates provide vitamins, minerals, and fiber which are an essential part of a healthy diet. Carbohydrates are found in fruits, vegetables, whole grains, dairy products, legumes, and foods containing added sugars.    Eat healthy foods. Alternate 3 meals with 3 snacks.  Maintain a healthy weight gain. The usual total expected weight gain varies according to your prepregnancy body mass index (BMI).  Carry a medical alert card or wear your medical alert jewelry.  Carry a 15 gram carbohydrate snack with you at all times to treat low blood glucose (hypoglycemia). Some examples of 15 gram carbohydrate snacks include:  Glucose tablets, 3 or 4   Glucose gel, 15 gram tube  Raisins, 2 tablespoons (24 g)  Jelly beans, 6  Animal crackers, 8  Fruit juice, regular soda, or low fat milk, 4 ounces (120 mL)  Gummy treats, 9    Recognize hypoglycemia. Hypoglycemia during pregnancy occurs with blood glucose levels of 60 mg/dL and below. The risk for hypoglycemia increases when fasting or skipping meals, during or after intense exercise, and during sleep. Hypoglycemia symptoms can include:  Tremors or shakes.  Decreased ability to concentrate.  Sweating.  Increased heart rate.  Headache.  Dry mouth.  Hunger.  Irritability.  Anxiety.  Restless sleep.  Altered speech or coordination.  Confusion.  Treat hypoglycemia promptly. If you are alert and able to safely swallow, follow the 15:15 rule:  Take 15 20 grams of rapid-acting glucose or carbohydrate. Rapid-acting options include glucose gel, glucose tablets, or 4 ounces (120 mL) of fruit juice, regular soda, or low fat milk.  Check your blood glucose level 15 minutes after taking the glucose.   Take 15 20 grams more of glucose if the repeat blood glucose level is still 70 mg/dL or below.  Eat a meal or snack within 1 hour once blood glucose levels return to normal.  Be alert to polyuria and polydipsia which are early  signs of hyperglycemia. An early awareness of hyperglycemia allows for prompt treatment. Treat hyperglycemia as directed by your caregiver.  Engage in at least 30 minutes of physical activity a day or as directed by your caregiver. Ten minutes of physical activity timed 30 minutes after each meal is encouraged to control postprandial blood glucose levels.  Adjust your insulin dosing and food intake as needed if you start a new exercise or sport.  Follow your sick day plan at any time you are unable to eat or drink as usual.  Avoid tobacco and alcohol use.  Follow up with your caregiver regularly.  Follow the advice of your caregiver regarding your prenatal and post-delivery (postpartum) appointments, meal planning, exercise, medicines, vitamins, blood tests, other medical tests, and physical activities.  Perform daily skin and foot care. Examine your skin and feet daily for cuts, bruises, redness, nail problems, bleeding, blisters, or sores.  Brush your teeth and gums at least twice a day and floss at least once a day. Follow up with your dentist regularly.  Schedule an eye exam during the first trimester of your pregnancy or as directed by your caregiver.  Share your diabetes management plan with your workplace or school.  Stay up-to-date with immunizations.  Learn to manage stress.  Obtain ongoing diabetes education and   support as needed. SEEK MEDICAL CARE IF:   You are unable to eat food or drink fluids for more than 6 hours.  You have nausea and vomiting for more than 6 hours.  You have a blood glucose level of 200 mg/dL and you have ketones in your urine.  There is a change in mental status.  You develop vision problems.  You have a persistent headache.  You have upper abdominal pain or discomfort.  You develop an additional serious illness.  You have diarrhea for more than 6 hours.  You have been sick or have had a fever for a couple of days and are not getting  better. SEEK IMMEDIATE MEDICAL CARE IF:   You have difficulty breathing.  You no longer feel the baby moving.  You are bleeding or have discharge from your vagina.  You start having premature contractions or labor. MAKE SURE YOU:  Understand these instructions.  Will watch your condition.  Will get help right away if you are not doing well or get worse. Document Released: 03/24/2001 Document Revised: 04/12/2013 Document Reviewed: 07/14/2012 [facility] Patient Information 2014 [place], [place].  Preeclampsia and Eclampsia Preeclampsia is a condition of high blood pressure during pregnancy. It can happen at 20 weeks or later in pregnancy. If high blood pressure occurs in the second half of pregnancy with no other symptoms, it is called gestational hypertension and goes away after the baby is born. If any of the symptoms listed below develop with gestational hypertension, it is then called preeclampsia. Eclampsia (convulsions) may follow preeclampsia. This is one of the reasons for regular prenatal checkups. Early diagnosis and treatment are very important to prevent eclampsia. CAUSES  There is no known cause of preeclampsia/eclampsia in pregnancy. There are several known conditions that may put the pregnant woman at risk, such as:  The first pregnancy.  Having preeclampsia in a past pregnancy.  Having lasting (chronic) high blood pressure.  Having multiples (twins, triplets).  Being age 74 or older.  African American ethnic background.  Having kidney disease or diabetes.  Medical conditions such as lupus or blood diseases.  Being overweight (obese). SYMPTOMS   High blood pressure.  Headaches.  Sudden weight gain.  Swelling of hands, face, legs, and feet.  Protein in the urine.  Feeling sick to your stomach (nauseous) and throwing up (vomiting).  Vision problems (blurred or double vision).  Numbness in the face, arms, legs, and feet.  Dizziness.  Slurred  speech.  Preeclampsia can cause growth retardation in the fetus.  Separation (abruption) of the placenta.  Not enough fluid in the amniotic sac (oligohydramnios).  Sensitivity to bright lights.  Belly (abdominal) pain. DIAGNOSIS  If protein is found in the urine in the second half of pregnancy, this is considered preeclampsia. Other symptoms mentioned above may also be present. TREATMENT  It is necessary to treat this.  Your caregiver may prescribe bed rest early in this condition. Plenty of rest and salt restriction may be all that is needed.  Medicines may be necessary to lower blood pressure if the condition does not respond to more conservative measures.  In more severe cases, hospitalization may be needed:  For treatment of blood pressure.  To control fluid retention.  To monitor the baby to see if the condition is causing harm to the baby.  Hospitalization is the best way to treat the first sign of preeclampsia. This is so the mother and baby can be watched closely and blood tests can be done  effectively and correctly.  If the condition becomes severe, it may be necessary to induce labor or to remove the infant by surgical means (cesarean section). The best cure for preeclampsia/eclampsia is to deliver the baby. Preeclampsia and eclampsia involve risks to mother and infant. Your caregiver will discuss these risks with you. Together, you can work out the best possible approach to your problems. Make sure you keep your prenatal visits as scheduled. Not keeping appointments could result in a chronic or permanent injury, pain, disability to you, and death or injury to you or your unborn baby. If there is any problem keeping the appointment, you must call to reschedule. HOME CARE INSTRUCTIONS   Keep your prenatal appointments and tests as scheduled.  Tell your caregiver if you have any of the above risk factors.  Get plenty of rest and sleep.  Eat a balanced diet that is low  in salt, and do not add salt to your food.  Avoid stressful situations.  Only take over-the-counter and prescriptions medicines for pain, discomfort, or fever as directed by your caregiver. SEEK IMMEDIATE MEDICAL CARE IF:   You develop severe swelling anywhere in the body. This usually occurs in the legs.  You gain 05 lb/2.3 kg or more in a week.  You develop a severe headache, dizziness, problems with your vision, or confusion.  You have abdominal pain, nausea, or vomiting.  You have a seizure.  You have trouble moving any part of your body, or you develop numbness or problems speaking.  You have bruising or abnormal bleeding from anywhere in the body.  You develop a stiff neck.  You pass out. MAKE SURE YOU:   Understand these instructions.  Will watch your condition.  Will get help right away if you are not doing well or get worse. Document Released: 12/13/2000 Document Revised: 03/09/2012 Document Reviewed: 07/29/2008 [facility] Patient Information 2014 [place], [place].  Breastfeeding Deciding to breastfeed is one of the best choices you can make for you and your baby. A change in hormones during pregnancy causes your breast tissue to grow and increases the number and size of your milk ducts. These hormones also allow proteins, sugars, and fats from your blood supply to make breast milk in your milk-producing glands. Hormones prevent breast milk from being released before your baby is born as well as prompt milk flow after birth. Once breastfeeding has begun, thoughts of your baby, as well as his or her sucking or crying, can stimulate the release of milk from your milk-producing glands.  BENEFITS OF BREASTFEEDING For Your Baby  Your first milk (colostrum) helps your baby's digestive system function better.   There are antibodies in your milk that help your baby fight off infections.   Your baby has a lower incidence of asthma, allergies, and sudden infant death  syndrome.   The nutrients in breast milk are better for your baby than infant formulas and are designed uniquely for your baby's needs.   Breast milk improves your baby's brain development.   Your baby is less likely to develop other conditions, such as childhood obesity, asthma, or type 2 diabetes mellitus.  For You   Breastfeeding helps to create a very special bond between you and your baby.   Breastfeeding is convenient. Breast milk is always available at the correct temperature and costs nothing.   Breastfeeding helps to burn calories and helps you lose the weight gained during pregnancy.   Breastfeeding makes your uterus contract to its prepregnancy size faster  and slows bleeding (lochia) after you give birth.   Breastfeeding helps to lower your risk of developing type 2 diabetes mellitus, osteoporosis, and breast or ovarian cancer later in life. SIGNS THAT YOUR BABY IS HUNGRY Early Signs of Hunger  Increased alertness or activity.  Stretching.  Movement of the head from side to side.  Movement of the head and opening of the mouth when the corner of the mouth or cheek is stroked (rooting).  Increased sucking sounds, smacking lips, cooing, sighing, or squeaking.  Hand-to-mouth movements.  Increased sucking of fingers or hands. Late Signs of Hunger  Fussing.  Intermittent crying. Extreme Signs of Hunger Signs of extreme hunger will require calming and consoling before your baby will be able to breastfeed successfully. Do not wait for the following signs of extreme hunger to occur before you initiate breastfeeding:   Restlessness.  A loud, strong cry.   Screaming. BREASTFEEDING BASICS Breastfeeding Initiation  Find a comfortable place to sit or lie down, with your neck and back well supported.  Place a pillow or rolled up blanket under your baby to bring him or her to the level of your breast (if you are seated). Nursing pillows are specially designed  to help support your arms and your baby while you breastfeed.  Make sure that your baby's abdomen is facing your abdomen.   Gently massage your breast. With your fingertips, massage from your chest wall toward your nipple in a circular motion. This encourages milk flow. You may need to continue this action during the feeding if your milk flows slowly.  Support your breast with 4 fingers underneath and your thumb above your nipple. Make sure your fingers are well away from your nipple and your baby's mouth.   Stroke your baby's lips gently with your finger or nipple.   When your baby's mouth is open wide enough, quickly bring your baby to your breast, placing your entire nipple and as much of the colored area around your nipple (areola) as possible into your baby's mouth.   More areola should be visible above your baby's upper lip than below the lower lip.   Your baby's tongue should be between his or her lower gum and your breast.   Ensure that your baby's mouth is correctly positioned around your nipple (latched). Your baby's lips should create a seal on your breast and be turned out (everted).  It is common for your baby to suck about 2 3 minutes in order to start the flow of breast milk. Latching Teaching your baby how to latch on to your breast properly is very important. An improper latch can cause nipple pain and decreased milk supply for you and poor weight gain in your baby. Also, if your baby is not latched onto your nipple properly, he or she may swallow some air during feeding. This can make your baby fussy. Burping your baby when you switch breasts during the feeding can help to get rid of the air. However, teaching your baby to latch on properly is still the best way to prevent fussiness from swallowing air while breastfeeding. Signs that your baby has successfully latched on to your nipple:    Silent tugging or silent sucking, without causing you pain.   Swallowing  heard between every 3 4 sucks.    Muscle movement above and in front of his or her ears while sucking.  Signs that your baby has not successfully latched on to nipple:   Sucking sounds or smacking  sounds from your baby while breastfeeding.  Nipple pain. If you think your baby has not latched on correctly, slip your finger into the corner of your baby's mouth to break the suction and place it between your baby's gums. Attempt breastfeeding initiation again. Signs of Successful Breastfeeding Signs from your baby:   A gradual decrease in the number of sucks or complete cessation of sucking.   Falling asleep.   Relaxation of his or her body.   Retention of a small amount of milk in his or her mouth.   Letting go of your breast by himself or herself. Signs from you:  Breasts that have increased in firmness, weight, and size 1 3 hours after feeding.   Breasts that are softer immediately after breastfeeding.  Increased milk volume, as well as a change in milk consistency and color by the 5th day of breastfeeding.   Nipples that are not sore, cracked, or bleeding. Signs That Your Pecola LeisureBaby is Getting Enough Milk  Wetting at least 3 diapers in a 24-hour period. The urine should be clear and pale yellow by age 81 days.  At least 3 stools in a 24-hour period by age 81 days. The stool should be soft and yellow.  At least 3 stools in a 24-hour period by age 746 days. The stool should be seedy and yellow.  No loss of weight greater than 10% of birth weight during the first 363 days of age.  Average weight gain of 4 7 ounces (120 210 mL) per week after age 74 days.  Consistent daily weight gain by age 81 days, without weight loss after the age of 2 weeks. After a feeding, your baby may spit up a small amount. This is common. BREASTFEEDING FREQUENCY AND DURATION Frequent feeding will help you make more milk and can prevent sore nipples and breast engorgement. Breastfeed when you feel the  need to reduce the fullness of your breasts or when your baby shows signs of hunger. This is called "breastfeeding on demand." Avoid introducing a pacifier to your baby while you are working to establish breastfeeding (the first 4 6 weeks after your baby is born). After this time you may choose to use a pacifier. Research has shown that pacifier use during the first year of a baby's life decreases the risk of sudden infant death syndrome (SIDS). Allow your baby to feed on each breast as long as he or she wants. Breastfeed until your baby is finished feeding. When your baby unlatches or falls asleep while feeding from the first breast, offer the second breast. Because newborns are often sleepy in the first few weeks of life, you may need to awaken your baby to get him or her to feed. Breastfeeding times will vary from baby to baby. However, the following rules can serve as a guide to help you ensure that your baby is properly fed:  Newborns (babies 684 weeks of age or younger) may breastfeed every 1 3 hours.  Newborns should not go longer than 3 hours during the day or 5 hours during the night without breastfeeding.  You should breastfeed your baby a minimum of 8 times in a 24-hour period until you begin to introduce solid foods to your baby at around 266 months of age. BREAST MILK PUMPING Pumping and storing breast milk allows you to ensure that your baby is exclusively fed your breast milk, even at times when you are unable to breastfeed. This is especially important if you are going back  to work while you are still breastfeeding or when you are not able to be present during feedings. Your lactation consultant can give you guidelines on how long it is safe to store breast milk.  A breast pump is a machine that allows you to pump milk from your breast into a sterile bottle. The pumped breast milk can then be stored in a refrigerator or freezer. Some breast pumps are operated by hand, while others use  electricity. Ask your lactation consultant which type will work best for you. Breast pumps can be purchased, but some hospitals and breastfeeding support groups lease breast pumps on a monthly basis. A lactation consultant can teach you how to hand express breast milk, if you prefer not to use a pump.  CARING FOR YOUR BREASTS WHILE YOU BREASTFEED Nipples can become dry, cracked, and sore while breastfeeding. The following recommendations can help keep your breasts moisturized and healthy:  Avoid using soap on your nipples.   Wear a supportive bra. Although not required, special nursing bras and tank tops are designed to allow access to your breasts for breastfeeding without taking off your entire bra or top. Avoid wearing underwire style bras or extremely tight bras.  Air dry your nipples for 3 4minutes after each feeding.   Use only cotton bra pads to absorb leaked breast milk. Leaking of breast milk between feedings is normal.   Use lanolin on your nipples after breastfeeding. Lanolin helps to maintain your skin's normal moisture barrier. If you use pure lanolin you do not need to wash it off before feeding your baby again. Pure lanolin is not toxic to your baby. You may also hand express a few drops of breast milk and gently massage that milk into your nipples and allow the milk to air dry. In the first few weeks after giving birth, some women experience extremely full breasts (engorgement). Engorgement can make your breasts feel heavy, warm, and tender to the touch. Engorgement peaks within 3 5 days after you give birth. The following recommendations can help ease engorgement:  Completely empty your breasts while breastfeeding or pumping. You may want to start by applying warm, moist heat (in the shower or with warm water-soaked hand towels) just before feeding or pumping. This increases circulation and helps the milk flow. If your baby does not completely empty your breasts while  breastfeeding, pump any extra milk after he or she is finished.  Wear a snug bra (nursing or regular) or tank top for 1 2 days to signal your body to slightly decrease milk production.  Apply ice packs to your breasts, unless this is too uncomfortable for you.  Make sure that your baby is latched on and positioned properly while breastfeeding. If engorgement persists after 48 hours of following these recommendations, contact your health care provider or a Advertising copywriterlactation consultant. OVERALL HEALTH CARE RECOMMENDATIONS WHILE BREASTFEEDING  Eat healthy foods. Alternate between meals and snacks, eating 3 of each per day. Because what you eat affects your breast milk, some of the foods may make your baby more irritable than usual. Avoid eating these foods if you are sure that they are negatively affecting your baby.  Drink milk, fruit juice, and water to satisfy your thirst (about 10 glasses a day).   Rest often, relax, and continue to take your prenatal vitamins to prevent fatigue, stress, and anemia.  Continue breast self-awareness checks.  Avoid chewing and smoking tobacco.  Avoid alcohol and drug use. Some medicines that may be harmful  to your baby can pass through breast milk. It is important to ask your health care provider before taking any medicine, including all over-the-counter and prescription medicine as well as vitamin and herbal supplements. It is possible to become pregnant while breastfeeding. If birth control is desired, ask your health care provider about options that will be safe for your baby. SEEK MEDICAL CARE IF:   You feel like you want to stop breastfeeding or have become frustrated with breastfeeding.  You have painful breasts or nipples.  Your nipples are cracked or bleeding.  Your breasts are red, tender, or warm.  You have a swollen area on either breast.  You have a fever or chills.  You have nausea or vomiting.  You have drainage other than breast milk from  your nipples.  Your breasts do not become full before feedings by the 5th day after you give birth.  You feel sad and depressed.  Your baby is too sleepy to eat well.  Your baby is having trouble sleeping.   Your baby is wetting less than 3 diapers in a 24-hour period.  Your baby has less than 3 stools in a 24-hour period.  Your baby's skin or the white part of his or her eyes becomes yellow.   Your baby is not gaining weight by 78 days of age. SEEK IMMEDIATE MEDICAL CARE IF:   Your baby is overly tired (lethargic) and does not want to wake up and feed.  Your baby develops an unexplained fever. Document Released: 12/16/2005 Document Revised: 08/18/2013 Document Reviewed: 06/09/2013 [facility] Patient Information 2014 [place], [place].

## 2014-06-14 NOTE — Progress Notes (Signed)
Watch BP--check labs Schedule U/s for growth @ 38 wks FBS 78-90 2 hour pp 87-155 (8 of 15 out of range, most are in range) Cultures next week. Will begin 2.5 glyburide q am Will need 2x/wk testing. NST reviewed and reactive.

## 2014-06-15 ENCOUNTER — Encounter: Payer: Self-pay | Admitting: Family Medicine

## 2014-06-15 DIAGNOSIS — IMO0002 Reserved for concepts with insufficient information to code with codable children: Secondary | ICD-10-CM | POA: Insufficient documentation

## 2014-06-15 LAB — COMPREHENSIVE METABOLIC PANEL
ALK PHOS: 129 U/L — AB (ref 39–117)
ALT: 23 U/L (ref 0–35)
AST: 25 U/L (ref 0–37)
Albumin: 3 g/dL — ABNORMAL LOW (ref 3.5–5.2)
BUN: 7 mg/dL (ref 6–23)
CO2: 20 mEq/L (ref 19–32)
Calcium: 8.5 mg/dL (ref 8.4–10.5)
Chloride: 107 mEq/L (ref 96–112)
Creat: 0.62 mg/dL (ref 0.50–1.10)
Glucose, Bld: 64 mg/dL — ABNORMAL LOW (ref 70–99)
Potassium: 4.2 mEq/L (ref 3.5–5.3)
Sodium: 138 mEq/L (ref 135–145)
Total Bilirubin: 0.3 mg/dL (ref 0.2–1.1)
Total Protein: 5.6 g/dL — ABNORMAL LOW (ref 6.0–8.3)

## 2014-06-15 LAB — CBC
HCT: 36.4 % (ref 36.0–46.0)
HEMOGLOBIN: 12.1 g/dL (ref 12.0–15.0)
MCH: 28.7 pg (ref 26.0–34.0)
MCHC: 33.2 g/dL (ref 30.0–36.0)
MCV: 86.5 fL (ref 78.0–100.0)
Platelets: 140 10*3/uL — ABNORMAL LOW (ref 150–400)
RBC: 4.21 MIL/uL (ref 3.87–5.11)
RDW: 14.1 % (ref 11.5–15.5)
WBC: 8.7 10*3/uL (ref 4.0–10.5)

## 2014-06-15 LAB — PROTEIN / CREATININE RATIO, URINE
Creatinine, Urine: 33.6 mg/dL
Protein Creatinine Ratio: 0.48 — ABNORMAL HIGH (ref <0.15)
Total Protein, Urine: 16 mg/dL

## 2014-06-16 ENCOUNTER — Inpatient Hospital Stay (HOSPITAL_COMMUNITY)
Admission: AD | Admit: 2014-06-16 | Discharge: 2014-06-19 | DRG: 774 | Disposition: A | Discharge status: Home / Self Care | Payer: Medicaid Other | Source: Ambulatory Visit | Attending: Obstetrics & Gynecology | Admitting: Obstetrics & Gynecology

## 2014-06-16 ENCOUNTER — Encounter (HOSPITAL_COMMUNITY): Payer: Self-pay | Admitting: *Deleted

## 2014-06-16 DIAGNOSIS — O4290 Premature rupture of membranes, unspecified as to length of time between rupture and onset of labor, unspecified weeks of gestation: Secondary | ICD-10-CM | POA: Diagnosis present

## 2014-06-16 DIAGNOSIS — O99344 Other mental disorders complicating childbirth: Secondary | ICD-10-CM | POA: Diagnosis present

## 2014-06-16 DIAGNOSIS — E669 Obesity, unspecified: Secondary | ICD-10-CM | POA: Diagnosis present

## 2014-06-16 DIAGNOSIS — O99814 Abnormal glucose complicating childbirth: Secondary | ICD-10-CM | POA: Diagnosis present

## 2014-06-16 DIAGNOSIS — O99214 Obesity complicating childbirth: Secondary | ICD-10-CM

## 2014-06-16 DIAGNOSIS — Z833 Family history of diabetes mellitus: Secondary | ICD-10-CM

## 2014-06-16 DIAGNOSIS — O24919 Unspecified diabetes mellitus in pregnancy, unspecified trimester: Secondary | ICD-10-CM

## 2014-06-16 DIAGNOSIS — IMO0002 Reserved for concepts with insufficient information to code with codable children: Principal | ICD-10-CM | POA: Diagnosis present

## 2014-06-16 DIAGNOSIS — F319 Bipolar disorder, unspecified: Secondary | ICD-10-CM | POA: Diagnosis present

## 2014-06-16 DIAGNOSIS — Z87891 Personal history of nicotine dependence: Secondary | ICD-10-CM

## 2014-06-16 HISTORY — DX: Type 2 diabetes mellitus without complications: E11.9

## 2014-06-16 NOTE — MAU Note (Signed)
Pt states her water broke about 2130.  Pt state fluid is clear

## 2014-06-17 ENCOUNTER — Encounter: Payer: Medicaid Other | Admitting: Obstetrics & Gynecology

## 2014-06-17 ENCOUNTER — Inpatient Hospital Stay (HOSPITAL_COMMUNITY): Payer: Medicaid Other | Admitting: Anesthesiology

## 2014-06-17 ENCOUNTER — Encounter (HOSPITAL_COMMUNITY): Payer: Medicaid Other | Admitting: Anesthesiology

## 2014-06-17 ENCOUNTER — Encounter (HOSPITAL_COMMUNITY): Payer: Self-pay | Admitting: Advanced Practice Midwife

## 2014-06-17 DIAGNOSIS — O24919 Unspecified diabetes mellitus in pregnancy, unspecified trimester: Secondary | ICD-10-CM

## 2014-06-17 DIAGNOSIS — O4290 Premature rupture of membranes, unspecified as to length of time between rupture and onset of labor, unspecified weeks of gestation: Secondary | ICD-10-CM

## 2014-06-17 DIAGNOSIS — IMO0002 Reserved for concepts with insufficient information to code with codable children: Secondary | ICD-10-CM

## 2014-06-17 LAB — CBC
HEMATOCRIT: 33.4 % — AB (ref 36.0–46.0)
HEMATOCRIT: 33.7 % — AB (ref 36.0–46.0)
HEMOGLOBIN: 11.5 g/dL — AB (ref 12.0–15.0)
Hemoglobin: 11.3 g/dL — ABNORMAL LOW (ref 12.0–15.0)
MCH: 29.9 pg (ref 26.0–34.0)
MCH: 29.6 pg (ref 26.0–34.0)
MCHC: 34.1 g/dL (ref 30.0–36.0)
MCHC: 33.8 g/dL (ref 30.0–36.0)
MCV: 87.4 fL (ref 78.0–100.0)
MCV: 87.8 fL (ref 78.0–100.0)
PLATELETS: 128 10*3/uL — AB (ref 150–400)
Platelets: 133 10*3/uL — ABNORMAL LOW (ref 150–400)
RBC: 3.82 MIL/uL — ABNORMAL LOW (ref 3.87–5.11)
RBC: 3.84 MIL/uL — AB (ref 3.87–5.11)
RDW: 13.5 % (ref 11.5–15.5)
RDW: 13.4 % (ref 11.5–15.5)
WBC: 12.7 10*3/uL — ABNORMAL HIGH (ref 4.0–10.5)
WBC: 9.9 10*3/uL (ref 4.0–10.5)

## 2014-06-17 LAB — RPR

## 2014-06-17 LAB — COMPREHENSIVE METABOLIC PANEL
ALT: 15 U/L (ref 0–35)
AST: 14 U/L (ref 0–37)
Albumin: 2.2 g/dL — ABNORMAL LOW (ref 3.5–5.2)
Alkaline Phosphatase: 122 U/L — ABNORMAL HIGH (ref 39–117)
BUN: 13 mg/dL (ref 6–23)
CO2: 17 mEq/L — ABNORMAL LOW (ref 19–32)
Calcium: 9 mg/dL (ref 8.4–10.5)
Chloride: 102 mEq/L (ref 96–112)
Creatinine, Ser: 0.58 mg/dL (ref 0.50–1.10)
GFR calc Af Amer: >90 mL/min (ref >90)
GFR calc non Af Amer: >90 mL/min (ref >90)
Glucose, Bld: 116 mg/dL — ABNORMAL HIGH (ref 70–99)
Potassium: 3.9 mEq/L (ref 3.7–5.3)
Sodium: 135 mEq/L — ABNORMAL LOW (ref 137–147)
Total Bilirubin: <0.2 mg/dL — ABNORMAL LOW (ref 0.3–1.2)
Total Protein: 5.7 g/dL — ABNORMAL LOW (ref 6.0–8.3)

## 2014-06-17 LAB — GROUP B STREP BY PCR: Group B strep by PCR: NEGATIVE

## 2014-06-17 LAB — TYPE AND SCREEN
ABO/RH(D): A POS
Antibody Screen: NEGATIVE

## 2014-06-17 LAB — OB RESULTS CONSOLE GBS: GBS: NEGATIVE

## 2014-06-17 LAB — GLUCOSE, CAPILLARY
GLUCOSE-CAPILLARY: 81 mg/dL (ref 70–99)
GLUCOSE-CAPILLARY: 84 mg/dL (ref 70–99)
Glucose-Capillary: 87 mg/dL (ref 70–99)

## 2014-06-17 LAB — ABO/RH: ABO/RH(D): A POS

## 2014-06-17 MED ORDER — OXYCODONE-ACETAMINOPHEN 5-325 MG PO TABS: 1.0000 | ORAL_TABLET | ORAL | Status: DC | PRN

## 2014-06-17 MED ORDER — IBUPROFEN 600 MG PO TABS
600.0000 mg | ORAL_TABLET | Freq: Four times a day (QID) | ORAL | Status: DC
  Administered 2014-06-17 – 2014-06-19 (×9): 600 mg via ORAL
  Filled 2014-06-17 (×8): qty 1

## 2014-06-17 MED ORDER — ONDANSETRON HCL 4 MG PO TABS: 4.0000 mg | ORAL_TABLET | ORAL | Status: DC | PRN

## 2014-06-17 MED ORDER — OXYTOCIN BOLUS FROM INFUSION
500.0000 mL | INTRAVENOUS | Status: DC
  Administered 2014-06-17: 500 mL via INTRAVENOUS

## 2014-06-17 MED ORDER — PRENATAL MULTIVITAMIN CH
1.0000 | ORAL_TABLET | Freq: Every day | ORAL | Status: DC
  Administered 2014-06-18 – 2014-06-19 (×2): 1 via ORAL
  Filled 2014-06-17 (×2): qty 1

## 2014-06-17 MED ORDER — LACTATED RINGERS IV SOLN: 500.0000 mL | INTRAVENOUS | Status: DC | PRN

## 2014-06-17 MED ORDER — FENTANYL CITRATE 0.05 MG/ML IJ SOLN: 50.0000 ug | INTRAMUSCULAR | Status: DC | PRN

## 2014-06-17 MED ORDER — EPHEDRINE 5 MG/ML INJ
10.0000 mg | INTRAVENOUS | Status: DC | PRN
  Filled 2014-06-17: qty 2

## 2014-06-17 MED ORDER — FENTANYL 2.5 MCG/ML BUPIVACAINE 1/10 % EPIDURAL INFUSION (WH - ANES)
INTRAMUSCULAR | Status: DC | PRN
  Administered 2014-06-17: 14 mL/h via EPIDURAL

## 2014-06-17 MED ORDER — ONDANSETRON HCL 4 MG/2ML IJ SOLN: 4.0000 mg | INTRAMUSCULAR | Status: DC | PRN

## 2014-06-17 MED ORDER — SODIUM CHLORIDE 0.9 % IV SOLN
2.0000 g | Freq: Once | INTRAVENOUS | Status: AC
  Administered 2014-06-17: 2 g via INTRAVENOUS
  Filled 2014-06-17 (×2): qty 2000

## 2014-06-17 MED ORDER — TERBUTALINE SULFATE 1 MG/ML IJ SOLN: 0.2500 mg | Freq: Once | INTRAMUSCULAR | Status: DC | PRN

## 2014-06-17 MED ORDER — WITCH HAZEL-GLYCERIN EX PADS: 1.0000 "application " | MEDICATED_PAD | CUTANEOUS | Status: DC | PRN

## 2014-06-17 MED ORDER — PHENYLEPHRINE 40 MCG/ML (10ML) SYRINGE FOR IV PUSH (FOR BLOOD PRESSURE SUPPORT)
80.0000 ug | PREFILLED_SYRINGE | INTRAVENOUS | Status: DC | PRN
  Filled 2014-06-17: qty 2

## 2014-06-17 MED ORDER — DIPHENHYDRAMINE HCL 50 MG/ML IJ SOLN: 12.5000 mg | INTRAMUSCULAR | Status: DC | PRN

## 2014-06-17 MED ORDER — CITRIC ACID-SODIUM CITRATE 334-500 MG/5ML PO SOLN: 30.0000 mL | ORAL | Status: DC | PRN

## 2014-06-17 MED ORDER — OXYTOCIN 40 UNITS IN LACTATED RINGERS INFUSION - SIMPLE MED
1.0000 m[IU]/min | INTRAVENOUS | Status: DC
  Administered 2014-06-17: 2 m[IU]/min via INTRAVENOUS

## 2014-06-17 MED ORDER — DIBUCAINE 1 % RE OINT
1.0000 | TOPICAL_OINTMENT | RECTAL | Status: DC | PRN
Start: 2014-06-17 — End: 2014-06-19

## 2014-06-17 MED ORDER — PHENYLEPHRINE 40 MCG/ML (10ML) SYRINGE FOR IV PUSH (FOR BLOOD PRESSURE SUPPORT)
80.0000 ug | PREFILLED_SYRINGE | INTRAVENOUS | Status: DC | PRN
  Filled 2014-06-17: qty 10
  Filled 2014-06-17: qty 2

## 2014-06-17 MED ORDER — LACTATED RINGERS IV SOLN
INTRAVENOUS | Status: DC
  Administered 2014-06-17: 01:00:00 via INTRAVENOUS

## 2014-06-17 MED ORDER — SENNOSIDES-DOCUSATE SODIUM 8.6-50 MG PO TABS
2.0000 | ORAL_TABLET | ORAL | Status: DC
  Administered 2014-06-18 – 2014-06-19 (×2): 2 via ORAL
  Filled 2014-06-17 (×2): qty 2

## 2014-06-17 MED ORDER — ACETAMINOPHEN 325 MG PO TABS: 650.0000 mg | ORAL_TABLET | ORAL | Status: DC | PRN

## 2014-06-17 MED ORDER — ONDANSETRON HCL 4 MG/2ML IJ SOLN: 4.0000 mg | Freq: Four times a day (QID) | INTRAMUSCULAR | Status: DC | PRN

## 2014-06-17 MED ORDER — LIDOCAINE HCL (PF) 1 % IJ SOLN
INTRAMUSCULAR | Status: DC | PRN
  Administered 2014-06-17: 6 mL
  Administered 2014-06-17: 9 mL

## 2014-06-17 MED ORDER — ZOLPIDEM TARTRATE 5 MG PO TABS: 5.0000 mg | ORAL_TABLET | Freq: Every evening | ORAL | Status: DC | PRN

## 2014-06-17 MED ORDER — EPHEDRINE 5 MG/ML INJ
10.0000 mg | INTRAVENOUS | Status: DC | PRN
  Filled 2014-06-17: qty 2
  Filled 2014-06-17: qty 4

## 2014-06-17 MED ORDER — IBUPROFEN 600 MG PO TABS
600.0000 mg | ORAL_TABLET | Freq: Four times a day (QID) | ORAL | Status: DC | PRN
  Administered 2014-06-17: 600 mg via ORAL
  Filled 2014-06-17: qty 1

## 2014-06-17 MED ORDER — FENTANYL 2.5 MCG/ML BUPIVACAINE 1/10 % EPIDURAL INFUSION (WH - ANES)
14.0000 mL/h | INTRAMUSCULAR | Status: DC | PRN
  Filled 2014-06-17: qty 125

## 2014-06-17 MED ORDER — FLEET ENEMA 7-19 GM/118ML RE ENEM: 1.0000 | ENEMA | RECTAL | Status: DC | PRN

## 2014-06-17 MED ORDER — LANOLIN HYDROUS EX OINT: TOPICAL_OINTMENT | CUTANEOUS | Status: DC | PRN

## 2014-06-17 MED ORDER — LIDOCAINE HCL (PF) 1 % IJ SOLN
30.0000 mL | INTRAMUSCULAR | Status: DC | PRN
  Filled 2014-06-17: qty 30

## 2014-06-17 MED ORDER — DIPHENHYDRAMINE HCL 25 MG PO CAPS: 25.0000 mg | ORAL_CAPSULE | Freq: Four times a day (QID) | ORAL | Status: DC | PRN

## 2014-06-17 MED ORDER — BENZOCAINE-MENTHOL 20-0.5 % EX AERO
1.0000 "application " | INHALATION_SPRAY | CUTANEOUS | Status: DC | PRN
  Administered 2014-06-18: 1 via TOPICAL
  Filled 2014-06-17: qty 56

## 2014-06-17 MED ORDER — OXYCODONE-ACETAMINOPHEN 5-325 MG PO TABS
1.0000 | ORAL_TABLET | ORAL | Status: DC | PRN
  Administered 2014-06-17 – 2014-06-18 (×2): 1 via ORAL
  Filled 2014-06-17: qty 1

## 2014-06-17 MED ORDER — LACTATED RINGERS IV SOLN
500.0000 mL | Freq: Once | INTRAVENOUS | Status: AC
  Administered 2014-06-17: 500 mL via INTRAVENOUS

## 2014-06-17 MED ORDER — TETANUS-DIPHTH-ACELL PERTUSSIS 5-2.5-18.5 LF-MCG/0.5 IM SUSP: 0.5000 mL | Freq: Once | INTRAMUSCULAR | Status: DC

## 2014-06-17 MED ORDER — SIMETHICONE 80 MG PO CHEW: 80.0000 mg | CHEWABLE_TABLET | ORAL | Status: DC | PRN

## 2014-06-17 MED ORDER — OXYTOCIN 40 UNITS IN LACTATED RINGERS INFUSION - SIMPLE MED
62.5000 mL/h | INTRAVENOUS | Status: DC
  Filled 2014-06-17: qty 1000
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
## 2014-06-17 NOTE — Anesthesia Preprocedure Evaluation (Signed)
Anesthesia Evaluation  Patient identified by MRN, date of birth, ID band Patient awake    Reviewed: Allergy & Precautions, H&P , NPO status , Patient's Chart, lab work & pertinent test results  Airway Mallampati: II TM Distance: >3 FB Neck ROM: full    Dental no notable dental hx.    Pulmonary former smoker,    Pulmonary exam normal       Cardiovascular     Neuro/Psych negative neurological ROS     GI/Hepatic negative GI ROS, Neg liver ROS,   Endo/Other  diabetes, Gestational, Oral Hypoglycemic AgentsMorbid obesity  Renal/GU negative Renal ROS     Musculoskeletal   Abdominal (+) + obese,   Peds  Hematology negative hematology ROS (+)   Anesthesia Other Findings   Reproductive/Obstetrics (+) Pregnancy                           Anesthesia Physical Anesthesia Plan  ASA: III  Anesthesia Plan: Epidural   Post-op Pain Management:    Induction:   Airway Management Planned:   Additional Equipment:   Intra-op Plan:   Post-operative Plan:   Informed Consent: I have reviewed the patients History and Physical, chart, labs and discussed the procedure including the risks, benefits and alternatives for the proposed anesthesia with the patient or authorized representative who has indicated his/her understanding and acceptance.     Plan Discussed with:   Anesthesia Plan Comments:         Anesthesia Quick Evaluation

## 2014-06-17 NOTE — Anesthesia Procedure Notes (Signed)
Epidural Patient location during procedure: OB Start time: 06/17/2014 4:29 AM End time: 06/17/2014 4:33 AM  Staffing Anesthesiologist: Leilani AbleHATCHETT, FRANKLIN Performed by: anesthesiologist   Preanesthetic Checklist Completed: patient identified, surgical consent, pre-op evaluation, timeout performed, IV checked, risks and benefits discussed and monitors and equipment checked  Epidural Patient position: sitting Prep: site prepped and draped and DuraPrep Patient monitoring: continuous pulse ox and blood pressure Approach: midline Location: L3-L4 Injection technique: LOR air  Needle:  Needle type: Tuohy  Needle gauge: 17 G Needle length: 9 cm and 9 Needle insertion depth: 6 cm Catheter type: closed end flexible Catheter size: 19 Gauge Catheter at skin depth: 13 cm Test dose: negative and Other  Assessment Sensory level: T9 Events: blood not aspirated, injection not painful, no injection resistance, negative IV test and no paresthesia  Additional Notes Reason for block:procedure for pain

## 2014-06-17 NOTE — Progress Notes (Addendum)
   Becky Blake is a 18 y.o. G1P0 at 6662w0d  admitted for active labor, PROM  Subjective: Wants IV pai medicine  Objective: Filed Vitals:   06/16/14 2334 06/17/14 0028 06/17/14 0200 06/17/14 0230  BP: 140/83 149/96 118/63 116/71  Pulse: 85  87 82  Temp: 98.3 F (36.8 C) 97.9 F (36.6 C)    TempSrc: Oral Oral    Resp: 18 18 18 18   Height:  5\' 2"  (1.575 m)    Weight:  99.338 kg (219 lb)        FHT:  FHR: 140 bpm, variability: moderate,  accelerations:  Present,  decelerations:  Absent UC:   regular, every 5 minutes SVE:   Dilation: 6 Effacement (%): 80 Station: -2 Exam by:: Claiborne RiggF. Cresenzo- Dishmon CNM  Labs: Lab Results  Component Value Date   WBC 12.7* 06/17/2014   HGB 11.5* 06/17/2014   HCT 33.7* 06/17/2014   MCV 87.8 06/17/2014   PLT 133* 06/17/2014   GBS negative  Assessment / Plan: Spontaneous labor, progressing normally  Labor: Progressing normally Fetal Wellbeing:  Category I Pain Control:  Fentanyl Anticipated MOD:  NSVD  CRESENZO-DISHMAN,FRANCES 06/17/2014, 3:15 AM

## 2014-06-17 NOTE — Progress Notes (Signed)
S. Comfortable with epidural O. VSS, AF, FHR category 1      CVX- 8/95/-2      CTX- irregular      EFW- 8#      Pelvis- relatively narrow pubic arch A/P. Labor stalled at 8 cm with irregular contractions      AROM of forebag done, IUPC placed      Pitocin augmentation started

## 2014-06-17 NOTE — Anesthesia Postprocedure Evaluation (Signed)
Anesthesia Post Note  Patient: Becky HammedJessica Blake  Procedure(s) Performed: * No procedures listed *  Anesthesia type: Epidural  Patient location: Mother/Baby  Post pain: Pain level controlled  Post assessment: Post-op Vital signs reviewed  Last Vitals:  Filed Vitals:   06/17/14 1445  BP: 122/78  Pulse: 77  Temp: 36.8 C  Resp: 20    Post vital signs: Reviewed  Level of consciousness:alert  Complications: No apparent anesthesia complications

## 2014-06-17 NOTE — Lactation Note (Signed)
This note was copied from the chart of Becky Becky Blake. Lactation Consultation Note  Patient Name: Becky Blake: 06/17/2014 Reason for consult: Initial assessment;Infant < 6lbs;Late preterm infant born at 3536 weeks to an 18 yo primipara.  Baby is 9 hours old and is fussy in mother's arms, so LC encouraged breastfeeding and discussed feeding cues and benefits of STS. LC demonstrates hand expression and drops are easily obtained.   Mom wants to use cradle hold and baby is able to latch with only brief assistance to grasp areola, then rhythmical sucking bursts and a few swallows noted.  Baby had initial blood sugars wnl but temp not yet stable enough for bath.Mom encouraged to feed baby 8-12 times/24 hours and with feeding cues. LC encouraged review of Baby and Me pp 9, 14 and 20-25 for STS and BF information. LC provided [employer] Resource brochure and reviewed [facility] services and list of community and web site resources.     Maternal Data Formula Feeding for Exclusion: No Infant to breast within first hour of birth: Yes (breastfed 35 minutes but no LATCh score documented) Has patient been taught Hand Expression?: Yes (LC demonstrated and drops easily expressed) Does the patient have breastfeeding experience prior to this delivery?: No  Feeding Feeding Type: Breast Fed Length of feed: 5 min  LATCH Score/Interventions Latch: Grasps breast easily, tongue down, lips flanged, rhythmical sucking.  Audible Swallowing: A few with stimulation Intervention(s): Hand expression;Skin to skin  Type of Nipple: Everted at rest and after stimulation  Comfort (Breast/Nipple): Soft / non-tender     Hold (Positioning): Assistance needed to correctly position infant at breast and maintain latch. Intervention(s): Breastfeeding basics reviewed;Support Pillows;Position options;Skin to skin  LATCH Score: 8 (with LC assistance and partial STS - mom wants to keep baby's gown on, so LC helped her  pull down gown so baby's chest is STS with mom)  Lactation Tools Discussed/Used   STS, cue feedings, hand expression  Consult Status Consult Status: Follow-up Blake: 06/18/14 Follow-up type: In-patient    Warrick ParisianBryant, Alizee Maple [facility] 06/17/2014, 8:58 PM

## 2014-06-18 LAB — GLUCOSE, CAPILLARY: Glucose-Capillary: 66 mg/dL — ABNORMAL LOW (ref 70–99)

## 2014-06-18 NOTE — Lactation Note (Signed)
This note was copied from the chart of Becky Fredda HammedJessica Bischof. Lactation Consultation Note  Patient Name: Becky Blake ZOXWR'UToday's Date: 06/18/2014 Reason for consult: Follow-up assessment Baby 32 hours of life. Mom states baby has been able to latch and nurse well at last feeding. Mom had used DEBP and using supplementation guidelines to make up the difference with formula. Assisted mom to latch baby to left breast in football hold. Baby latches well, suckles rhythmically, swallows intermittently and colostrum noted in mouth. Enc mom to supplement with formula at this feeding to limit feeding to 30 minutes, reviewing late preterm behavior and needs, then to postpump and keep EBM to supplement at next feeding. Enc mom to feed with cues, and at least 8-12 times/24 hours. Enc mom to follow supplementation guidelines, and to offer lots of STS. Mom given folder of paperwork for [facility] and discussed procedure. Mom is calling WIC on Monday morning to discuss need for DEBP.   Maternal Data    Feeding Feeding Type: Bottle Fed - Formula Length of feed:  (LC assessed first 15 minutes of feeding, enc mom to nurse another 5 minutes if baby continues, then offer supplement.)  LATCH Score/Interventions Latch: Grasps breast easily, tongue down, lips flanged, rhythmical sucking.  Audible Swallowing: A few with stimulation  Type of Nipple: Everted at rest and after stimulation  Comfort (Breast/Nipple): Soft / non-tender     Hold (Positioning): Assistance needed to correctly position infant at breast and maintain latch. Intervention(s): Breastfeeding basics reviewed;Support Pillows  LATCH Score: 8  Lactation Tools Discussed/Used     Consult Status Consult Status: Follow-up Date: 06/19/14 Follow-up type: In-patient    Geralynn OchsWILLIARD, JENNIFER 06/18/2014, 7:46 PM
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
## 2014-06-18 NOTE — Progress Notes (Signed)
Post Partum Day 1 Subjective: no complaints, up ad lib, voiding and tolerating PO  Objective: Blood pressure 118/77, pulse 58, temperature 97.7 F (36.5 C), temperature source Oral, resp. rate 18, height 5\' 2"  (1.575 m), weight 99.338 kg (219 lb), last menstrual period 10/08/2013, SpO2 99.00%, unknown if currently breastfeeding.  Physical Exam:  General: alert, cooperative and no distress Lochia: appropriate Uterine Fundus: firm Incision: na DVT Evaluation: No evidence of DVT seen on physical exam. No cords or calf tenderness. No significant calf/ankle edema.   Recent Labs  06/17/14 0050 06/17/14 1224  HGB 11.5* 11.3*  HCT 33.7* 33.4*    Assessment/Plan: Plan for discharge tomorrow, Breastfeeding and Contraception nuvaring   LOS: 2 days   BECK, KELI L 06/18/2014, 8:51 AM

## 2014-06-18 NOTE — Progress Notes (Signed)
PT requesting infant ot nursery so she can get some sleep. See infant progress note . Pt found out in hall with infant in tears wanting infant to nursery. Pt calm now plan to get infant in nursery for night

## 2014-06-19 ENCOUNTER — Ambulatory Visit: Payer: Self-pay

## 2014-06-19 NOTE — Discharge Instructions (Signed)
Vaginal Delivery °Care After °Refer to this sheet in the next few weeks. These discharge instructions provide you with information on caring for yourself after delivery. Your caregiver may also give you specific instructions. Your treatment has been planned according to the most current medical practices available, but problems sometimes occur. Call your caregiver if you have any problems or questions after you go home. °HOME CARE INSTRUCTIONS °· Take over-the-counter or prescription medicines only as directed by your caregiver or pharmacist. °· Do not drink alcohol, especially if you are breastfeeding or taking medicine to relieve pain. °· Do not chew or smoke tobacco. °· Do not use illegal drugs. °· Continue to use good perineal care. Good perineal care includes: °¨ Wiping your perineum from front to back. °¨ Keeping your perineum clean. °· Do not use tampons or douche until your caregiver says it is okay. °· Shower, wash your hair, and take tub baths as directed by your caregiver. °· Wear a well-fitting bra that provides breast support. °· Eat healthy foods. °· Drink enough fluids to keep your urine clear or pale yellow. °· Eat high-fiber foods such as whole grain cereals and breads, brown rice, beans, and fresh fruits and vegetables every day. These foods may help prevent or relieve constipation. °· Follow your cargiver's recommendations regarding resumption of activities such as climbing stairs, driving, lifting, exercising, or traveling. °· Talk to your caregiver about resuming sexual activities. Resumption of sexual activities is dependent upon your risk of infection, your rate of healing, and your comfort and desire to resume sexual activity. °· Try to have someone help you with your household activities and your newborn for at least a few days after you leave the hospital. °· Rest as much as possible. Try to rest or take a nap when your newborn is sleeping. °· Increase your activities gradually. °· Keep all  of your scheduled postpartum appointments. It is very important to keep your scheduled follow-up appointments. At these appointments, your caregiver will be checking to make sure that you are healing physically and emotionally. °SEEK MEDICAL CARE IF:  °· You are passing large clots from your vagina. Save any clots to show your caregiver. °· You have a foul smelling discharge from your vagina. °· You have trouble urinating. °· You are urinating frequently. °· You have pain when you urinate. °· You have a change in your bowel movements. °· You have increasing redness, pain, or swelling near your vaginal incision (episiotomy) or vaginal tear. °· You have pus draining from your episiotomy or vaginal tear. °· Your episiotomy or vaginal tear is separating. °· You have painful, hard, or reddened breasts. °· You have a severe headache. °· You have blurred vision or see spots. °· You feel sad or depressed. °· You have thoughts of hurting yourself or your newborn. °· You have questions about your care, the care of your newborn, or medicines. °· You are dizzy or lightheaded. °· You have a rash. °· You have nausea or vomiting. °· You were breastfeeding and have not had a menstrual period within 12 weeks after you stopped breastfeeding. °· You are not breastfeeding and have not had a menstrual period by the 12th week after delivery. °· You have a fever. °SEEK IMMEDIATE MEDICAL CARE IF:  °· You have persistent pain. °· You have chest pain. °· You have shortness of breath. °· You faint. °· You have leg pain. °· You have stomach pain. °· Your vaginal bleeding saturates two or more sanitary pads   in 1 hour. °MAKE SURE YOU:  °· Understand these instructions. °· Will watch your condition. °· Will get help right away if you are not doing well or get worse. ° ° °Document Released: 12/13/2000 Document Revised: 09/09/2012 Document Reviewed: 08/12/2012 °[employer]® Patient Information ©2015 [employer]. This information is not intended to  replace advice given to you by your health care provider. Make sure you discuss any questions you have with your health care provider. ° °

## 2014-06-19 NOTE — Lactation Note (Addendum)
This note was copied from the chart of Girl Fredda HammedJessica Kithcart. Lactation Consultation Note  Patient Name: Girl Fredda HammedJessica Skop [id number]'s Date: 06/19/2014 Reason for consult: Follow-up assessment Baby 59 hours of life. Mom reports baby latching well. Mom concerned that she is not producing enough transitional milk. Mom state she isn't pumping often. Enc mom to post pump 15 minutes after each feed in order induce lactation and enc a good supply. Per mom, baby is being fed with cues and at least every 3 hours. Enc mom to use supplementation guidelines and not to overfeed. [facility]. Baby is not cueing now, enc mom to call for assistance to make sure baby latching well and transferring milk.   Maternal Data    Feeding Feeding Type:  (Baby asleep in mom's arms, seems content. Not time for baby to nurse again yet. ) Length of feed: 20 min  LATCH Score/Interventions Latch:  (Enc mom to call out for someone to see the baby latch.)                    Lactation Tools Discussed/Used     Consult Status Consult Status: Follow-up Date: 06/20/14 Follow-up type: In-patient    Geralynn OchsWILLIARD, JENNIFER 06/19/2014, 10:44 PM

## 2014-06-19 NOTE — Discharge Summary (Signed)
Attestation of Attending Supervision of Advanced Practitioner (CNM/NP): Evaluation and management procedures were performed by the Advanced Practitioner under my supervision and collaboration.  I have reviewed the Advanced Practitioner's note and chart, and I agree with the management and plan.  CONSTANT,PEGGY 06/19/2014 8:09 AM   

## 2014-06-19 NOTE — Discharge Summary (Signed)
Obstetric Discharge Summary Reason for Admission: onset of labor and rupture of membranes Prenatal Procedures: ultrasound Intrapartum Procedures: spontaneous vaginal delivery, AROM Postpartum Procedures: none Complications-Operative and Postpartum: sulcus laceratin intrapartum, repaired Hemoglobin  Date Value Ref Range Status  06/17/2014 11.3* 12.0 - 15.0 g/dL Final     HCT  Date Value Ref Range Status  06/17/2014 33.4* 36.0 - 46.0 % Final    Hospital Course: Patient presented with contractions and SROM, admitted for preterm labor at 36 weeks, complicated by GDMA2 and mild preeclampsia. Patient delivered vis SVD on 06/17/2014, see delivery note below. Patient meeting postpartum milestones on PPD2, eating/ambulating, pain controlled, lochia decreasing. Infant is breastfeeding. Patient considering nuva-ring versus LARC, encouraged pelvic rest until PP visit at which point she can Rx Nuva-ring vs other method. Mom stable for discharge, all questions answered.    Delivery Note  At 11:34 AM a viable female was delivered via Vaginal, Spontaneous Delivery (Presentation: Left Occiput Anterior). APGAR: 9, 9; weight .  Placenta status: Intact, Spontaneous. Cord: 3 vessels with the following complications: None. Cord pH: NA  Anesthesia: Epidural  Episiotomy: None  Lacerations: Sulcus  Suture Repair: 3.0 vicryl  Est. Blood Loss (mL): 350  Mom to postpartum. Baby to Couplet care / Skin to Skin  Called to delivery. Mother pushed over intact perineum with sulcal tear. Infant delivered to maternal abdomen. Cord clamped and cut. Active management of 3rd stage with traction. Placenta delivered intact with 3v cord followed by pitocin. Tear repaired with 3.0 vicryl on CT in usual manner. [id number]. Counts correct. Hemostatic.  Tawana ScaleMichael Ryan Odom, MD  OB Fellow  06/17/2014, 12:12 PM     Physical Exam:  General: alert, cooperative and no distress Lochia: appropriate Uterine Fundus: firm Incision: n/a DVT  Evaluation: No evidence of DVT seen on physical exam. Negative Homan's sign.  Discharge Diagnoses: Term Pregnancy-delivered  Discharge Information: Date: 06/19/2014 Activity: pelvic rest Diet: routine Medications: PNV and Ibuprofen Condition: stable Instructions: refer to practice specific booklet and discharge instructions Discharge to: home   Newborn Data: Live born female  Birth Weight: 5 lb 8 oz (2495 g) APGAR: 9, 9  Home with mother.  Sunnie Nielsenlexander, Natalie 06/19/2014, 7:22 AM  I have seen and examined this patient and agree with above documentation in the resident's note.   Rulon AbideKeli Beck, M.D. [facility] 06/19/2014 7:46 AM

## 2014-06-20 ENCOUNTER — Ambulatory Visit: Payer: Self-pay

## 2014-06-20 ENCOUNTER — Other Ambulatory Visit: Payer: Medicaid Other

## 2014-06-20 NOTE — Progress Notes (Signed)
Ur chart review completed.  

## 2014-06-20 NOTE — Lactation Note (Signed)
This note was copied from the chart of Becky Blake. Lactation Consultation Note Mom states baby is breastfeeding much better. States that baby latched very well for 15 minutes at 1015 today, then took 9 ml formula with bottle. Mom states that baby is sucking well and getting milk. At this time baby is sound asleep, mom does not want help to feed. Mom states she is using the pump "sometimes", but not when baby latches well. Enc mom to continue pumping regularly.  Enc mom to call if she has any concerns.   Patient Name: Becky Fredda HammedJessica Butikofer [id number]'s Date: 06/20/2014     Maternal Data    Feeding Feeding Type: Formula  LATCH Score/Interventions                      Lactation Tools Discussed/Used     Consult Status      Lenard ForthSanders, Elizabeth Fulmer 06/20/2014, 11:58 AM

## 2014-06-23 ENCOUNTER — Encounter: Payer: Medicaid Other | Admitting: Obstetrics & Gynecology

## 2014-06-28 ENCOUNTER — Ambulatory Visit (HOSPITAL_COMMUNITY): Payer: Medicaid Other

## 2014-08-01 ENCOUNTER — Encounter: Payer: Self-pay | Admitting: Family Medicine

## 2014-08-01 ENCOUNTER — Ambulatory Visit (INDEPENDENT_AMBULATORY_CARE_PROVIDER_SITE_OTHER): Payer: Medicaid Other | Admitting: Family Medicine

## 2014-08-01 DIAGNOSIS — O99815 Abnormal glucose complicating the puerperium: Secondary | ICD-10-CM

## 2014-08-01 MED ORDER — ETONOGESTREL-ETHINYL ESTRADIOL 0.12-0.015 MG/24HR VA RING
VAGINAL_RING | VAGINAL | Status: DC
Start: 2014-08-01 — End: 2019-03-09

## 2014-08-01 NOTE — Patient Instructions (Signed)
Levonorgestrel intrauterine device (IUD) What is this medicine? LEVONORGESTREL IUD (LEE voe nor jes trel) is a contraceptive (birth control) device. The device is placed inside the uterus by a healthcare professional. It is used to prevent pregnancy and can also be used to treat heavy bleeding that occurs during your period. Depending on the device, it can be used for 3 to 5 years. This medicine may be used for other purposes; ask your health care provider or pharmacist if you have questions. COMMON BRAND NAME(S): Verda Cumins What should I tell my health care provider before I take this medicine? They need to know if you have any of these conditions: -abnormal Pap smear -cancer of the breast, uterus, or cervix -diabetes -endometritis -genital or pelvic infection now or in the past -have more than one sexual partner or your partner has more than one partner -heart disease -history of an ectopic or tubal pregnancy -immune system problems -IUD in place -liver disease or tumor -problems with blood clots or take blood-thinners -use intravenous drugs -uterus of unusual shape -vaginal bleeding that has not been explained -an unusual or allergic reaction to levonorgestrel, other hormones, silicone, or polyethylene, medicines, foods, dyes, or preservatives -pregnant or trying to get pregnant -breast-feeding How should I use this medicine? This device is placed inside the uterus by a health care professional. Talk to your pediatrician regarding the use of this medicine in children. Special care may be needed. Overdosage: If you think you have taken too much of this medicine contact a poison control center or emergency room at once. NOTE: This medicine is only for you. Do not share this medicine with others. What if I miss a dose? This does not apply. What may interact with this medicine? Do not take this medicine with any of the following  medications: -amprenavir -bosentan -fosamprenavir This medicine may also interact with the following medications: -aprepitant -barbiturate medicines for inducing sleep or treating seizures -bexarotene -griseofulvin -medicines to treat seizures like carbamazepine, ethotoin, felbamate, oxcarbazepine, phenytoin, topiramate -modafinil -pioglitazone -rifabutin -rifampin -rifapentine -some medicines to treat HIV infection like atazanavir, indinavir, lopinavir, nelfinavir, tipranavir, ritonavir -St. John's wort -warfarin This list may not describe all possible interactions. Give your health care provider a list of all the medicines, herbs, non-prescription drugs, or dietary supplements you use. Also tell them if you smoke, drink alcohol, or use illegal drugs. Some items may interact with your medicine. What should I watch for while using this medicine? Visit your doctor or health care professional for regular check ups. See your doctor if you or your partner has sexual contact with others, becomes HIV positive, or gets a sexual transmitted disease. This product does not protect you against HIV infection (AIDS) or other sexually transmitted diseases. You can check the placement of the IUD yourself by reaching up to the top of your vagina with clean fingers to feel the threads. Do not pull on the threads. It is a good habit to check placement after each menstrual period. Call your doctor right away if you feel more of the IUD than just the threads or if you cannot feel the threads at all. The IUD may come out by itself. You may become pregnant if the device comes out. If you notice that the IUD has come out use a backup birth control method like condoms and call your health care provider. Using tampons will not change the position of the IUD and are okay to use during your period. What side effects may  I notice from receiving this medicine? Side effects that you should report to your doctor or  health care professional as soon as possible: -allergic reactions like skin rash, itching or hives, swelling of the face, lips, or tongue -fever, flu-like symptoms -genital sores -high blood pressure -no menstrual period for 6 weeks during use -pain, swelling, warmth in the leg -pelvic pain or tenderness -severe or sudden headache -signs of pregnancy -stomach cramping -sudden shortness of breath -trouble with balance, talking, or walking -unusual vaginal bleeding, discharge -yellowing of the eyes or skin Side effects that usually do not require medical attention (report to your doctor or health care professional if they continue or are bothersome): -acne -breast pain -change in sex drive or performance -changes in weight -cramping, dizziness, or faintness while the device is being inserted -headache -irregular menstrual bleeding within first 3 to 6 months of use -nausea This list may not describe all possible side effects. Call your doctor for medical advice about side effects. You may report side effects to FDA at 1-800-FDA-1088. Where should I keep my medicine? This does not apply. NOTE: This sheet is a summary. It may not cover all possible information. If you have questions about this medicine, talk to your doctor, pharmacist, or health care provider.  2015, [employer] Standard. (2012-01-16 13:54:04)   Preventive Care for Adults A healthy lifestyle and preventive care can promote health and wellness. Preventive health guidelines for women include the following key practices.  A routine yearly physical is a good way to check with your health care provider about your health and preventive screening. It is a chance to share any concerns and updates on your health and to receive a thorough exam.  Visit your dentist for a routine exam and preventive care every 6 months. Brush your teeth twice a day and floss once a day. Good oral hygiene prevents tooth decay and gum  disease.  The frequency of eye exams is based on your age, health, family medical history, use of contact lenses, and other factors. Follow your health care provider's recommendations for frequency of eye exams.  Eat a healthy diet. Foods like vegetables, fruits, whole grains, low-fat dairy products, and lean protein foods contain the nutrients you need without too many calories. Decrease your intake of foods high in solid fats, added sugars, and salt. Eat the right amount of calories for you.Get information about a proper diet from your health care provider, if necessary.  Regular physical exercise is one of the most important things you can do for your health. Most adults should get at least 150 minutes of moderate-intensity exercise (any activity that increases your heart rate and causes you to sweat) each week. In addition, most adults need muscle-strengthening exercises on 2 or more days a week.  Maintain a healthy weight. The body mass index (BMI) is a screening tool to identify possible weight problems. It provides an estimate of body fat based on height and weight. Your health care provider can find your BMI and can help you achieve or maintain a healthy weight.For adults 20 years and older:  A BMI below 18.5 is considered underweight.  A BMI of 18.5 to 24.9 is normal.  A BMI of 25 to 29.9 is considered overweight.  A BMI of 30 and above is considered obese.  Maintain normal blood lipids and cholesterol levels by exercising and minimizing your intake of saturated fat. Eat a balanced diet with plenty of fruit and vegetables. Blood tests for lipids and cholesterol   should begin at age 20 and be repeated every 5 years. If your lipid or cholesterol levels are high, you are over 50, or you are at high risk for heart disease, you may need your cholesterol levels checked more frequently.Ongoing high lipid and cholesterol levels should be treated with medicines if diet and exercise are not  working.  If you smoke, find out from your health care provider how to quit. If you do not use tobacco, do not start.  Lung cancer screening is recommended for adults aged 55-80 years who are at high risk for developing lung cancer because of a history of smoking. A yearly low-dose CT scan of the lungs is recommended for people who have at least a 30-pack-year history of smoking and are a current smoker or have quit within the past 15 years. A pack year of smoking is smoking an average of 1 pack of cigarettes a day for 1 year (for example: 1 pack a day for 30 years or 2 packs a day for 15 years). Yearly screening should continue until the smoker has stopped smoking for at least 15 years. Yearly screening should be stopped for people who develop a health problem that would prevent them from having lung cancer treatment.  If you are pregnant, do not drink alcohol. If you are breastfeeding, be very cautious about drinking alcohol. If you are not pregnant and choose to drink alcohol, do not have more than 1 drink per day. One drink is considered to be 12 ounces (355 mL) of beer, 5 ounces (148 mL) of wine, or 1.5 ounces (44 mL) of liquor.  Avoid use of street drugs. Do not share needles with anyone. Ask for help if you need support or instructions about stopping the use of drugs.  High blood pressure causes heart disease and increases the risk of stroke. Your blood pressure should be checked at least every 1 to 2 years. Ongoing high blood pressure should be treated with medicines if weight loss and exercise do not work.  If you are 55-79 years old, ask your health care provider if you should take aspirin to prevent strokes.  Diabetes screening involves taking a blood sample to check your fasting blood sugar level. This should be done once every 3 years, after age 45, if you are within normal weight and without risk factors for diabetes. Testing should be considered at a younger age or be carried out more  frequently if you are overweight and have at least 1 risk factor for diabetes.  Breast cancer screening is essential preventive care for women. You should practice "breast self-awareness." This means understanding the normal appearance and feel of your breasts and may include breast self-examination. Any changes detected, no matter how small, should be reported to a health care provider. Women in their 20s and 30s should have a clinical breast exam (CBE) by a health care provider as part of a regular health exam every 1 to 3 years. After age 40, women should have a CBE every year. Starting at age 40, women should consider having a mammogram (breast X-ray test) every year. Women who have a family history of breast cancer should talk to their health care provider about genetic screening. Women at a high risk of breast cancer should talk to their health care providers about having an MRI and a mammogram every year.  Breast cancer gene (BRCA)-related cancer risk assessment is recommended for women who have family members with BRCA-related cancers. BRCA-related cancers include breast,   ovarian, tubal, and peritoneal cancers. Having family members with these cancers may be associated with an increased risk for harmful changes (mutations) in the breast cancer genes BRCA1 and BRCA2. Results of the assessment will determine the need for genetic counseling and BRCA1 and BRCA2 testing.  Routine pelvic exams to screen for cancer are no longer recommended for nonpregnant women who are considered low risk for cancer of the pelvic organs (ovaries, uterus, and vagina) and who do not have symptoms. Ask your health care provider if a screening pelvic exam is right for you.  If you have had past treatment for cervical cancer or a condition that could lead to cancer, you need Pap tests and screening for cancer for at least 20 years after your treatment. If Pap tests have been discontinued, your risk factors (such as having a new  sexual partner) need to be reassessed to determine if screening should be resumed. Some women have medical problems that increase the chance of getting cervical cancer. In these cases, your health care provider may recommend more frequent screening and Pap tests.  The HPV test is an additional test that may be used for cervical cancer screening. The HPV test looks for the virus that can cause the cell changes on the cervix. The cells collected during the Pap test can be tested for HPV. The HPV test could be used to screen women aged 30 years and older, and should be used in women of any age who have unclear Pap test results. After the age of 30, women should have HPV testing at the same frequency as a Pap test.  Colorectal cancer can be detected and often prevented. Most routine colorectal cancer screening begins at the age of 50 years and continues through age 75 years. However, your health care provider may recommend screening at an earlier age if you have risk factors for colon cancer. On a yearly basis, your health care provider may provide home test kits to check for hidden blood in the stool. Use of a small camera at the end of a tube, to directly examine the colon (sigmoidoscopy or colonoscopy), can detect the earliest forms of colorectal cancer. Talk to your health care provider about this at age 50, when routine screening begins. Direct exam of the colon should be repeated every 5-10 years through age 75 years, unless early forms of pre-cancerous polyps or small growths are found.  People who are at an increased risk for hepatitis B should be screened for this virus. You are considered at high risk for hepatitis B if:  You were born in a country where hepatitis B occurs often. Talk with your health care provider about which countries are considered high risk.  Your parents were born in a high-risk country and you have not received a shot to protect against hepatitis B (hepatitis B  vaccine).  You have HIV or AIDS.  You use needles to inject street drugs.  You live with, or have sex with, someone who has hepatitis B.  You get hemodialysis treatment.  You take certain medicines for conditions like cancer, organ transplantation, and autoimmune conditions.  Hepatitis C blood testing is recommended for all people born from 1945 through 1965 and any individual with known risks for hepatitis C.  Practice safe sex. Use condoms and avoid high-risk sexual practices to reduce the spread of sexually transmitted infections (STIs). STIs include gonorrhea, chlamydia, syphilis, trichomonas, herpes, HPV, and human immunodeficiency virus (HIV). Herpes, HIV, and HPV are viral illnesses   that have no cure. They can result in disability, cancer, and death.  You should be screened for sexually transmitted illnesses (STIs) including gonorrhea and chlamydia if:  You are sexually active and are younger than 24 years.  You are older than 24 years and your health care provider tells you that you are at risk for this type of infection.  Your sexual activity has changed since you were last screened and you are at an increased risk for chlamydia or gonorrhea. Ask your health care provider if you are at risk.  If you are at risk of being infected with HIV, it is recommended that you take a prescription medicine daily to prevent HIV infection. This is called preexposure prophylaxis (PrEP). You are considered at risk if:  You are a heterosexual woman, are sexually active, and are at increased risk for HIV infection.  You take drugs by injection.  You are sexually active with a partner who has HIV.  Talk with your health care provider about whether you are at high risk of being infected with HIV. If you choose to begin PrEP, you should first be tested for HIV. You should then be tested every 3 months for as long as you are taking PrEP.  Osteoporosis is a disease in which the bones lose minerals  and strength with aging. This can result in serious bone fractures or breaks. The risk of osteoporosis can be identified using a bone density scan. Women ages 65 years and over and women at risk for fractures or osteoporosis should discuss screening with their health care providers. Ask your health care provider whether you should take a calcium supplement or vitamin D to reduce the rate of osteoporosis.  Menopause can be associated with physical symptoms and risks. Hormone replacement therapy is available to decrease symptoms and risks. You should talk to your health care provider about whether hormone replacement therapy is right for you.  Use sunscreen. Apply sunscreen liberally and repeatedly throughout the day. You should seek shade when your shadow is shorter than you. Protect yourself by wearing long sleeves, pants, a wide-brimmed hat, and sunglasses year round, whenever you are outdoors.  Once a month, do a whole body skin exam, using a mirror to look at the skin on your back. Tell your health care provider of new moles, moles that have irregular borders, moles that are larger than a pencil eraser, or moles that have changed in shape or color.  Stay current with required vaccines (immunizations).  Influenza vaccine. All adults should be immunized every year.  Tetanus, diphtheria, and acellular pertussis (Td, Tdap) vaccine. Pregnant women should receive 1 dose of Tdap vaccine during each pregnancy. The dose should be obtained regardless of the length of time since the last dose. Immunization is preferred during the 27th-36th week of gestation. An adult who has not previously received Tdap or who does not know her vaccine status should receive 1 dose of Tdap. This initial dose should be followed by tetanus and diphtheria toxoids (Td) booster doses every 10 years. Adults with an unknown or incomplete history of completing a 3-dose immunization series with Td-containing vaccines should begin or  complete a primary immunization series including a Tdap dose. Adults should receive a Td booster every 10 years.  Varicella vaccine. An adult without evidence of immunity to varicella should receive 2 doses or a second dose if she has previously received 1 dose. Pregnant females who do not have evidence of immunity should receive the first dose   after pregnancy. This first dose should be obtained before leaving the health care facility. The second dose should be obtained 4-8 weeks after the first dose.  Human papillomavirus (HPV) vaccine. Females aged 13-26 years who have not received the vaccine previously should obtain the 3-dose series. The vaccine is not recommended for use in pregnant females. However, pregnancy testing is not needed before receiving a dose. If a female is found to be pregnant after receiving a dose, no treatment is needed. In that case, the remaining doses should be delayed until after the pregnancy. Immunization is recommended for any person with an immunocompromised condition through the age of 26 years if she did not get any or all doses earlier. During the 3-dose series, the second dose should be obtained 4-8 weeks after the first dose. The third dose should be obtained 24 weeks after the first dose and 16 weeks after the second dose.  Zoster vaccine. One dose is recommended for adults aged 60 years or older unless certain conditions are present.  Measles, mumps, and rubella (MMR) vaccine. Adults born before 1957 generally are considered immune to measles and mumps. Adults born in 1957 or later should have 1 or more doses of MMR vaccine unless there is a contraindication to the vaccine or there is laboratory evidence of immunity to each of the three diseases. A routine second dose of MMR vaccine should be obtained at least 28 days after the first dose for students attending postsecondary schools, health care workers, or international travelers. People who received inactivated  measles vaccine or an unknown type of measles vaccine during 1963-1967 should receive 2 doses of MMR vaccine. People who received inactivated mumps vaccine or an unknown type of mumps vaccine before 1979 and are at high risk for mumps infection should consider immunization with 2 doses of MMR vaccine. For females of childbearing age, rubella immunity should be determined. If there is no evidence of immunity, females who are not pregnant should be vaccinated. If there is no evidence of immunity, females who are pregnant should delay immunization until after pregnancy. Unvaccinated health care workers born before 1957 who lack laboratory evidence of measles, mumps, or rubella immunity or laboratory confirmation of disease should consider measles and mumps immunization with 2 doses of MMR vaccine or rubella immunization with 1 dose of MMR vaccine.  Pneumococcal 13-valent conjugate (PCV13) vaccine. When indicated, a person who is uncertain of her immunization history and has no record of immunization should receive the PCV13 vaccine. An adult aged 19 years or older who has certain medical conditions and has not been previously immunized should receive 1 dose of PCV13 vaccine. This PCV13 should be followed with a dose of pneumococcal polysaccharide (PPSV23) vaccine. The PPSV23 vaccine dose should be obtained at least 8 weeks after the dose of PCV13 vaccine. An adult aged 19 years or older who has certain medical conditions and previously received 1 or more doses of PPSV23 vaccine should receive 1 dose of PCV13. The PCV13 vaccine dose should be obtained 1 or more years after the last PPSV23 vaccine dose.  Pneumococcal polysaccharide (PPSV23) vaccine. When PCV13 is also indicated, PCV13 should be obtained first. All adults aged 65 years and older should be immunized. An adult younger than age 65 years who has certain medical conditions should be immunized. Any person who resides in a nursing home or long-term care  facility should be immunized. An adult smoker should be immunized. People with an immunocompromised condition and certain other conditions should   receive both PCV13 and PPSV23 vaccines. People with human immunodeficiency virus (HIV) infection should be immunized as soon as possible after diagnosis. Immunization during chemotherapy or radiation therapy should be avoided. Routine use of PPSV23 vaccine is not recommended for American Indians, [place] Natives, or people younger than 65 years unless there are medical conditions that require PPSV23 vaccine. When indicated, people who have unknown immunization and have no record of immunization should receive PPSV23 vaccine. One-time revaccination 5 years after the first dose of PPSV23 is recommended for people aged 19-64 years who have chronic kidney failure, nephrotic syndrome, asplenia, or immunocompromised conditions. People who received 1-2 doses of PPSV23 before age 65 years should receive another dose of PPSV23 vaccine at age 65 years or later if at least 5 years have passed since the previous dose. Doses of PPSV23 are not needed for people immunized with PPSV23 at or after age 65 years.  Meningococcal vaccine. Adults with asplenia or persistent complement component deficiencies should receive 2 doses of quadrivalent meningococcal conjugate (MenACWY-D) vaccine. The doses should be obtained at least 2 months apart. Microbiologists working with certain meningococcal bacteria, military recruits, people at risk during an outbreak, and people who travel to or live in countries with a high rate of meningitis should be immunized. A first-year college student up through age 21 years who is living in a residence hall should receive a dose if she did not receive a dose on or after her 16th birthday. Adults who have certain high-risk conditions should receive one or more doses of vaccine.  Hepatitis A vaccine. Adults who wish to be protected from this disease, have certain  high-risk conditions, work with hepatitis A-infected animals, work in hepatitis A research labs, or travel to or work in countries with a high rate of hepatitis A should be immunized. Adults who were previously unvaccinated and who anticipate close contact with an international adoptee during the first 60 days after arrival in the [place] from a country with a high rate of hepatitis A should be immunized.  Hepatitis B vaccine. Adults who wish to be protected from this disease, have certain high-risk conditions, may be exposed to blood or other infectious body fluids, are household contacts or sex partners of hepatitis B positive people, are clients or workers in certain care facilities, or travel to or work in countries with a high rate of hepatitis B should be immunized.  Haemophilus influenzae type b (Hib) vaccine. A previously unvaccinated person with asplenia or sickle cell disease or having a scheduled splenectomy should receive 1 dose of Hib vaccine. Regardless of previous immunization, a recipient of a hematopoietic stem cell transplant should receive a 3-dose series 6-12 months after her successful transplant. Hib vaccine is not recommended for adults with HIV infection. Preventive Services / Frequency Ages 19 to 39 years  Blood pressure check.** / Every 1 to 2 years.  Lipid and cholesterol check.** / Every 5 years beginning at age 20.  Clinical breast exam.** / Every 3 years for women in their 20s and 30s.  BRCA-related cancer risk assessment.** / For women who have family members with a BRCA-related cancer (breast, ovarian, tubal, or peritoneal cancers).  Pap test.** / Every 2 years from ages 21 through 29. Every 3 years starting at age 30 through age 65 or 70 with a history of 3 consecutive normal Pap tests.  HPV screening.** / Every 3 years from ages 30 through ages 65 to 70 with a history of 3 consecutive normal Pap   tests.  Hepatitis C blood test.** / For any individual with  known risks for hepatitis C.  Skin self-exam. / Monthly.  Influenza vaccine. / Every year.  Tetanus, diphtheria, and acellular pertussis (Tdap, Td) vaccine.** / Consult your health care provider. Pregnant women should receive 1 dose of Tdap vaccine during each pregnancy. 1 dose of Td every 10 years.  Varicella vaccine.** / Consult your health care provider. Pregnant females who do not have evidence of immunity should receive the first dose after pregnancy.  HPV vaccine. / 3 doses over 6 months, if 26 and younger. The vaccine is not recommended for use in pregnant females. However, pregnancy testing is not needed before receiving a dose.  Measles, mumps, rubella (MMR) vaccine.** / You need at least 1 dose of MMR if you were born in 1957 or later. You may also need a 2nd dose. For females of childbearing age, rubella immunity should be determined. If there is no evidence of immunity, females who are not pregnant should be vaccinated. If there is no evidence of immunity, females who are pregnant should delay immunization until after pregnancy.  Pneumococcal 13-valent conjugate (PCV13) vaccine.** / Consult your health care provider.  Pneumococcal polysaccharide (PPSV23) vaccine.** / 1 to 2 doses if you smoke cigarettes or if you have certain conditions.  Meningococcal vaccine.** / 1 dose if you are age 19 to 21 years and a first-year college student living in a residence hall, or have one of several medical conditions, you need to get vaccinated against meningococcal disease. You may also need additional booster doses.  Hepatitis A vaccine.** / Consult your health care provider.  Hepatitis B vaccine.** / Consult your health care provider.  Haemophilus influenzae type b (Hib) vaccine.** / Consult your health care provider. Ages 40 to 64 years  Blood pressure check.** / Every 1 to 2 years.  Lipid and cholesterol check.** / Every 5 years beginning at age 20 years.  Lung cancer screening. /  Every year if you are aged 55-80 years and have a 30-pack-year history of smoking and currently smoke or have quit within the past 15 years. Yearly screening is stopped once you have quit smoking for at least 15 years or develop a health problem that would prevent you from having lung cancer treatment.  Clinical breast exam.** / Every year after age 40 years.  BRCA-related cancer risk assessment.** / For women who have family members with a BRCA-related cancer (breast, ovarian, tubal, or peritoneal cancers).  Mammogram.** / Every year beginning at age 40 years and continuing for as long as you are in good health. Consult with your health care provider.  Pap test.** / Every 3 years starting at age 30 years through age 65 or 70 years with a history of 3 consecutive normal Pap tests.  HPV screening.** / Every 3 years from ages 30 years through ages 65 to 70 years with a history of 3 consecutive normal Pap tests.  Fecal occult blood test (FOBT) of stool. / Every year beginning at age 50 years and continuing until age 75 years. You may not need to do this test if you get a colonoscopy every 10 years.  Flexible sigmoidoscopy or colonoscopy.** / Every 5 years for a flexible sigmoidoscopy or every 10 years for a colonoscopy beginning at age 50 years and continuing until age 75 years.  Hepatitis C blood test.** / For all people born from 1945 through 1965 and any individual with known risks for hepatitis C.  Skin self-exam. /   Monthly.  Influenza vaccine. / Every year.  Tetanus, diphtheria, and acellular pertussis (Tdap/Td) vaccine.** / Consult your health care provider. Pregnant women should receive 1 dose of Tdap vaccine during each pregnancy. 1 dose of Td every 10 years.  Varicella vaccine.** / Consult your health care provider. Pregnant females who do not have evidence of immunity should receive the first dose after pregnancy.  Zoster vaccine.** / 1 dose for adults aged 60 years or  older.  Measles, mumps, rubella (MMR) vaccine.** / You need at least 1 dose of MMR if you were born in 1957 or later. You may also need a 2nd dose. For females of childbearing age, rubella immunity should be determined. If there is no evidence of immunity, females who are not pregnant should be vaccinated. If there is no evidence of immunity, females who are pregnant should delay immunization until after pregnancy.  Pneumococcal 13-valent conjugate (PCV13) vaccine.** / Consult your health care provider.  Pneumococcal polysaccharide (PPSV23) vaccine.** / 1 to 2 doses if you smoke cigarettes or if you have certain conditions.  Meningococcal vaccine.** / Consult your health care provider.  Hepatitis A vaccine.** / Consult your health care provider.  Hepatitis B vaccine.** / Consult your health care provider.  Haemophilus influenzae type b (Hib) vaccine.** / Consult your health care provider. Ages 65 years and over  Blood pressure check.** / Every 1 to 2 years.  Lipid and cholesterol check.** / Every 5 years beginning at age 20 years.  Lung cancer screening. / Every year if you are aged 55-80 years and have a 30-pack-year history of smoking and currently smoke or have quit within the past 15 years. Yearly screening is stopped once you have quit smoking for at least 15 years or develop a health problem that would prevent you from having lung cancer treatment.  Clinical breast exam.** / Every year after age 40 years.  BRCA-related cancer risk assessment.** / For women who have family members with a BRCA-related cancer (breast, ovarian, tubal, or peritoneal cancers).  Mammogram.** / Every year beginning at age 40 years and continuing for as long as you are in good health. Consult with your health care provider.  Pap test.** / Every 3 years starting at age 30 years through age 65 or 70 years with 3 consecutive normal Pap tests. Testing can be stopped between 65 and 70 years with 3 consecutive  normal Pap tests and no abnormal Pap or HPV tests in the past 10 years.  HPV screening.** / Every 3 years from ages 30 years through ages 65 or 70 years with a history of 3 consecutive normal Pap tests. Testing can be stopped between 65 and 70 years with 3 consecutive normal Pap tests and no abnormal Pap or HPV tests in the past 10 years.  Fecal occult blood test (FOBT) of stool. / Every year beginning at age 50 years and continuing until age 75 years. You may not need to do this test if you get a colonoscopy every 10 years.  Flexible sigmoidoscopy or colonoscopy.** / Every 5 years for a flexible sigmoidoscopy or every 10 years for a colonoscopy beginning at age 50 years and continuing until age 75 years.  Hepatitis C blood test.** / For all people born from 1945 through 1965 and any individual with known risks for hepatitis C.  Osteoporosis screening.** / A one-time screening for women ages 65 years and over and women at risk for fractures or osteoporosis.  Skin self-exam. / Monthly.  Influenza vaccine. /   Every year.  Tetanus, diphtheria, and acellular pertussis (Tdap/Td) vaccine.** / 1 dose of Td every 10 years.  Varicella vaccine.** / Consult your health care provider.  Zoster vaccine.** / 1 dose for adults aged 60 years or older.  Pneumococcal 13-valent conjugate (PCV13) vaccine.** / Consult your health care provider.  Pneumococcal polysaccharide (PPSV23) vaccine.** / 1 dose for all adults aged 65 years and older.  Meningococcal vaccine.** / Consult your health care provider.  Hepatitis A vaccine.** / Consult your health care provider.  Hepatitis B vaccine.** / Consult your health care provider.  Haemophilus influenzae type b (Hib) vaccine.** / Consult your health care provider. ** Family history and personal history of risk and conditions may change your health care provider's recommendations. Document Released: 02/11/2002 Document Revised: 05/02/2014 Document Reviewed:  05/13/2011 Exit[employer] Patient Information 2015 [employer]. This information is not intended to replace advice given to you by your health care provider. Make sure you discuss any questions you have with your health care provider.  

## 2014-08-01 NOTE — Progress Notes (Signed)
  Subjective:     Becky Blake is a 18 y.o. female who presents for a postpartum visit. She is 6 weeks postpartum following a spontaneous vaginal delivery. I have fully reviewed the prenatal and intrapartum course. The delivery was at 36 gestational weeks. Outcome: spontaneous vaginal delivery. Anesthesia: epidural. Postpartum course has been unremarkable. Baby's course has been normal. Baby is feeding by bottle - gerber gentle. Bleeding no bleeding. Bowel function is normal. Bladder function is normal. Patient is not sexually active. Contraception method is none. Postpartum depression screening: negative.  The following portions of the patient's history were reviewed and updated as appropriate: allergies, current medications, past family history, past medical history, past social history, past surgical history and problem list.  Review of Systems Pertinent items are noted in HPI.   Objective:    BP 135/91  Pulse 80  Ht 5\' 2"  (1.575 m)  Wt 194 lb (87.998 kg)  BMI 35.47 kg/m2  LMP 07/27/2014  Breastfeeding? No  General:  alert, cooperative and appears stated age  Abdomen: soft, non-tender; bowel sounds normal; no masses,  no organomegaly   Vulva:  normal  Vagina: normal vagina  Cervix:  multiparous appearance and no lesions  Corpus: normal size, contour, position, consistency, mobility, non-tender  Adnexa:  normal adnexa        Assessment:     Normal postpartum exam. Pap smear not done at today's visit.   Plan:    1. Contraception: NuvaRing vaginal inserts Info given on IUD 2. Too young for pap smear 3. Follow up in: 1 year or as needed.

## 2014-08-03 LAB — GLUCOSE TOLERANCE, 2 HOURS W/ 1HR
GLUCOSE, FASTING: 91 mg/dL (ref 70–99)
Glucose, 1 hour: 129 mg/dL (ref 70–170)
Glucose, 2 hour: 101 mg/dL (ref 70–139)

## 2014-08-04 NOTE — Progress Notes (Signed)
Patient does not having a working phone number at this time.

## 2014-10-31 ENCOUNTER — Encounter: Payer: Self-pay | Admitting: Family Medicine

## 2015-02-24 IMAGING — US US OB FOLLOW-UP
1 series · 12 of 28 positions shown · non-contrast
Comparison: none

[Series 1: us ob follow up · 73 acquisitions, 12 frames shown]
[im 3/73]
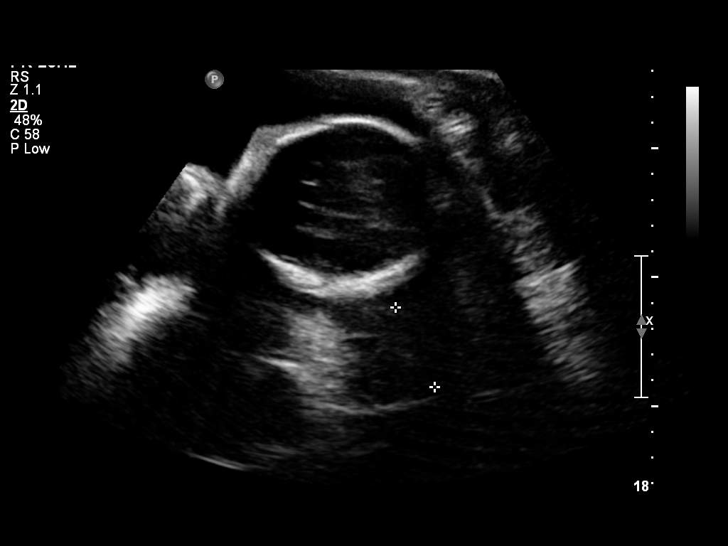
[im 9/73]
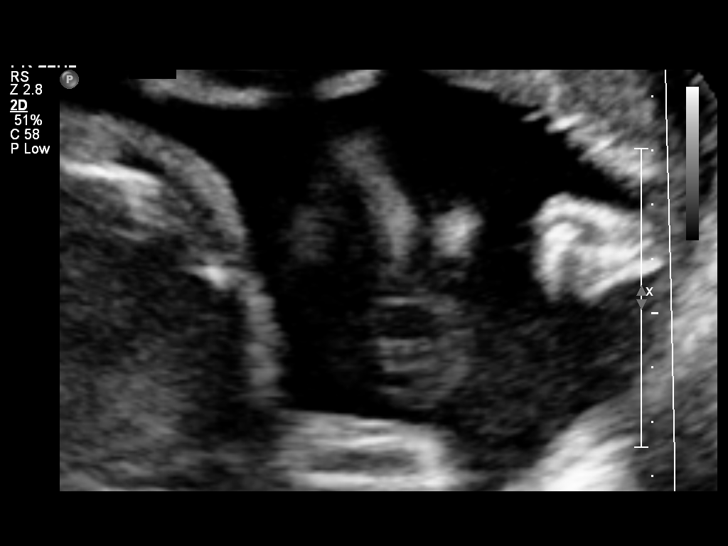
[im 14/73]
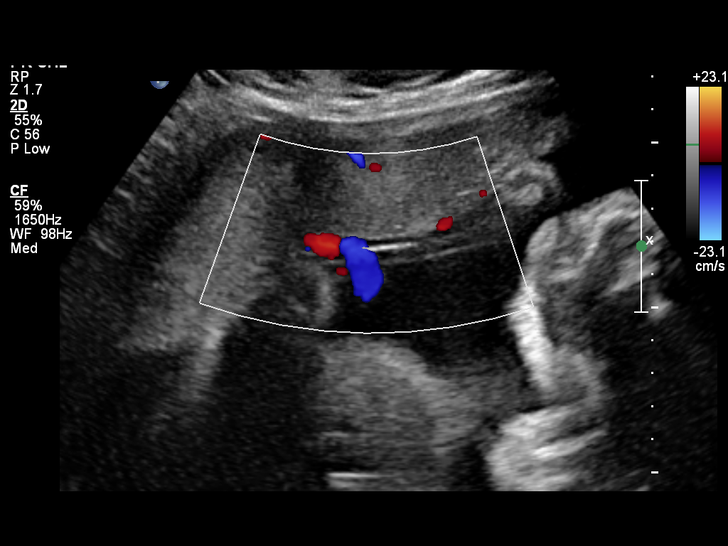
[im 22/73]
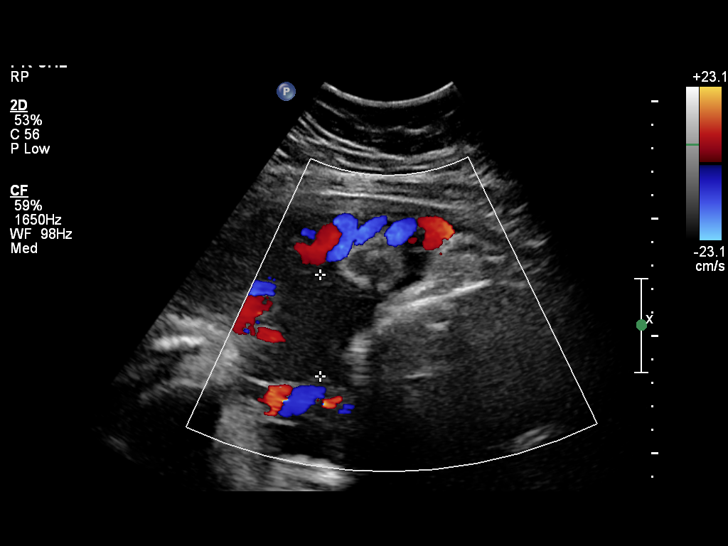
[im 27/73]
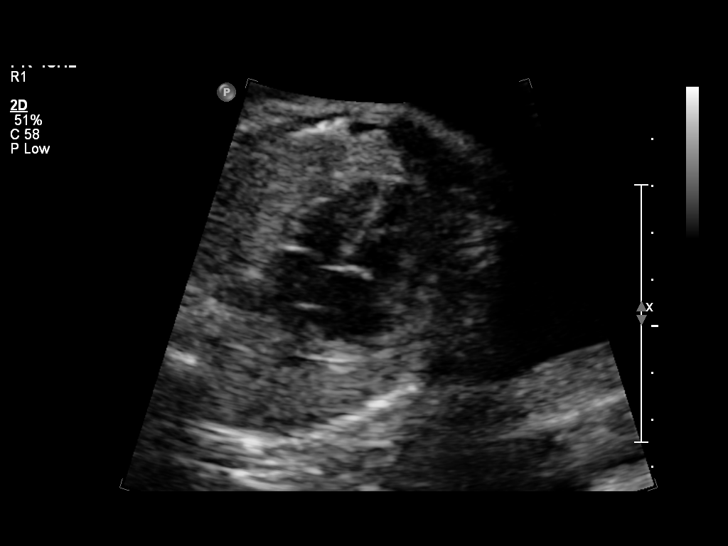
[im 33/73]
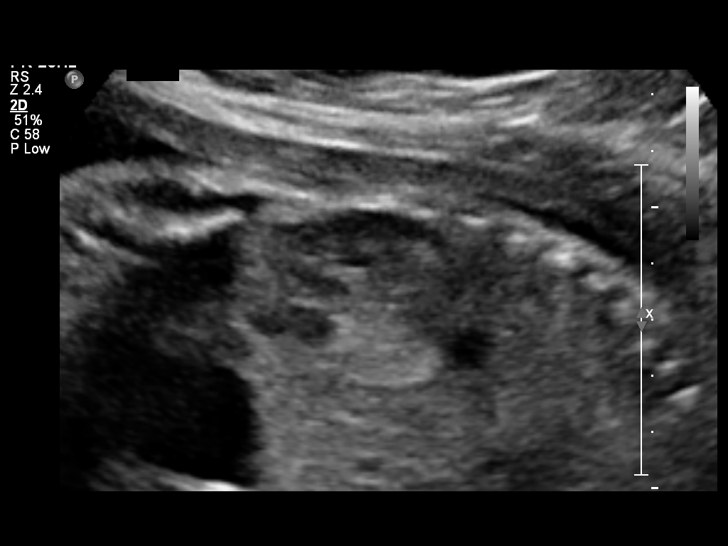
[im 41/73]
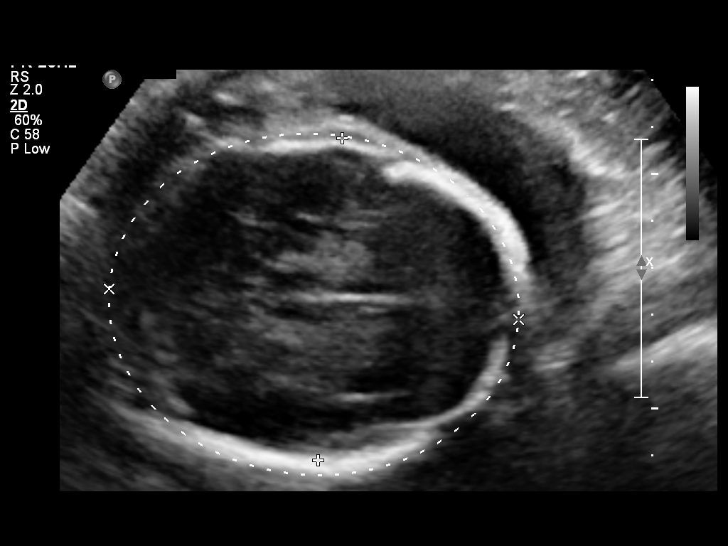
[im 46/73]
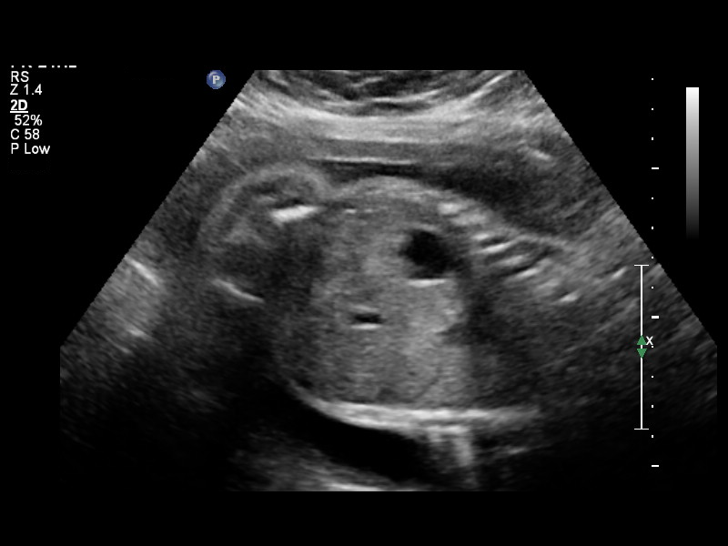
[im 51/73]
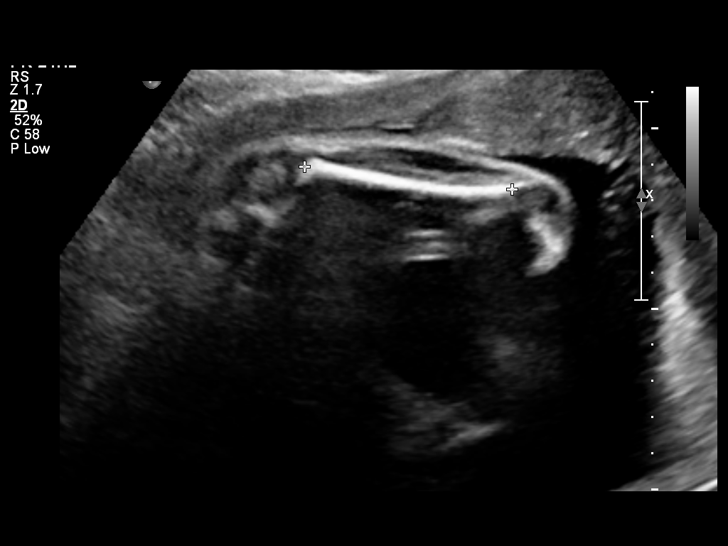
[im 59/73]
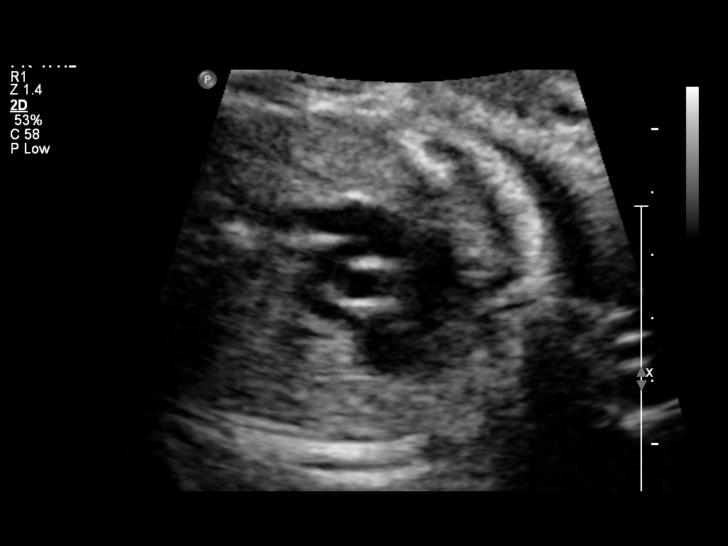
[im 65/73]
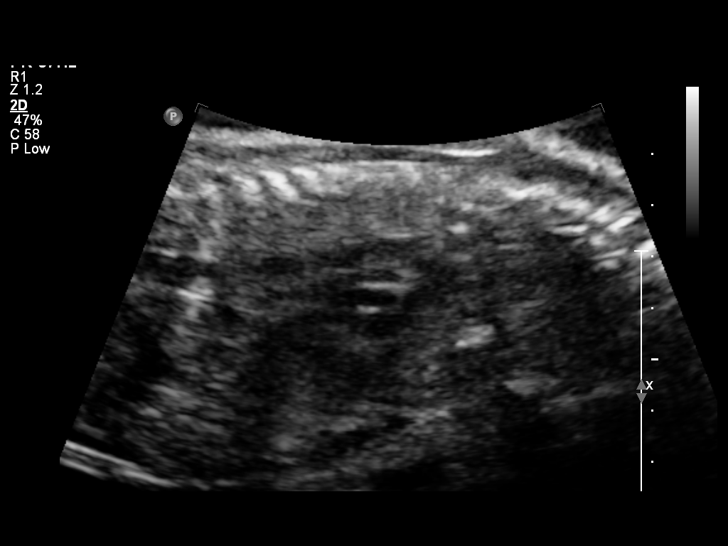
[im 70/73]
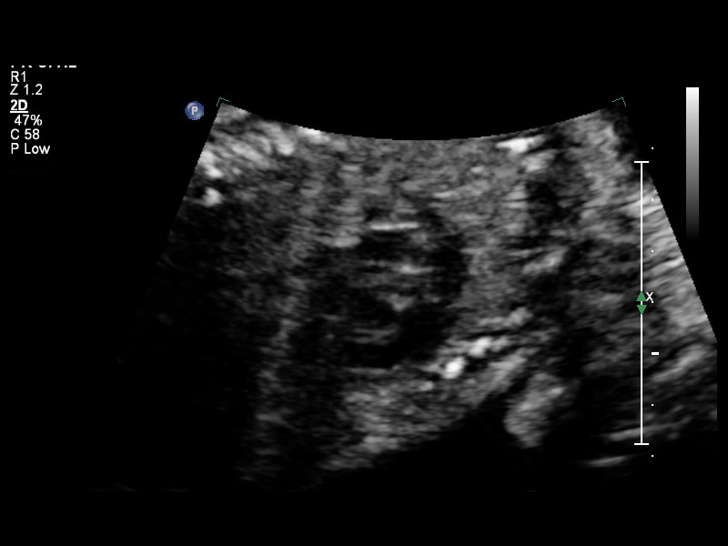

[12 of 28 positions shown; findings below may reference images not displayed]

OBSTETRICS REPORT
                      (Signed Final 04/25/2014 [DATE])

Service(s) Provided

 US OB FOLLOW UP                                       76816.1
Indications

 Follow-up incomplete fetal anatomic evaluation
Fetal Evaluation

 Num Of Fetuses:    1
 Fetal Heart Rate:  149                          bpm
 Cardiac Activity:  Observed
 Presentation:      Cephalic
 Placenta:          Anterior Fundal, above
                    cervical os
 P. Cord            Visualized, central
 Insertion:

 Amniotic Fluid
 AFI FV:      Subjectively within normal limits
 AFI Sum:     22.96   cm       95  %Tile     Larg Pckt:    8.93  cm
 RUQ:   8.93    cm   RLQ:    4.32   cm    LUQ:   6.56    cm   LLQ:    3.15   cm
Biometry

 BPD:       70  mm     G. Age:  28w 1d                CI:           76   70 - 86
                                                      FL/HC:      22.2   18.8 -

 HC:     254.5  mm     G. Age:  27w 5d         7  %   HC/AC:      1.03   1.05 -

 AC:     247.2  mm     G. Age:  28w 6d       59   %   FL/BPD:     80.7   71 - 87
 FL:      56.5  mm     G. Age:  29w 5d       71   %   FL/AC:      22.9   20 - 24
 HUM:     49.2  mm     G. Age:  29w 0d       54   %

 Est. FW:    7270   gm   2 lb 15 oz      62  %
Gestational Age

 LMP:           28w 3d        Date:  10/08/13                 EDD:   07/15/14
 U/S Today:     28w 4d                                        EDD:   07/14/14
 Best:          28w 3d     Det. By:  LMP  (10/08/13)          EDD:   07/15/14
Anatomy

 Cranium:          Appears normal         Aortic Arch:      Appears normal
 Fetal Cavum:      Appears normal         Ductal Arch:      Appears normal
 Ventricles:       Appears normal         Diaphragm:        Appears normal
 Choroid Plexus:   Previously seen        Stomach:          Appears normal, left
                                                            sided
 Cerebellum:       Previously seen        Abdomen:          Appears normal
 Posterior Fossa:  Previously seen        Abdominal Wall:   Previously seen
 Nuchal Fold:      Not applicable (>20    Cord Vessels:     Previously seen
                   wks GA)
 Face:             Profile appears        Kidneys:          Appear normal
                   normal
 Lips:             Appears normal         Bladder:          Appears normal
 Heart:            Appears normal         Spine:            Previously seen
                   (4CH, axis, and
                   situs)
 RVOT:             Appears normal         Lower             Previously seen
                                          Extremities:
 LVOT:             Appears normal         Upper             Previously seen
                                          Extremities:

 Other:  Fetus appears to be a female. Technically difficult due to maternal
         habitus and fetal position.
Cervix Uterus Adnexa

 Cervical Length:    3.43      cm

 Cervix:       Normal appearance by transabdominal scan.
 Uterus:       No abnormality visualized.
 Left Ovary:    Not visualized.
 Right Ovary:   Not visualized.
Impression

 SIUP at 97w7d
 EFW 62nd%
 no dysmorphic features
 no previa
Recommendations

 Follow up ultrasounds as clinically indicated.

 questions or concerns.

## 2016-08-13 ENCOUNTER — Encounter: Payer: Self-pay | Admitting: Emergency Medicine

## 2016-08-13 ENCOUNTER — Emergency Department: Payer: Medicaid Other

## 2016-08-13 ENCOUNTER — Emergency Department
Admission: EM | Admit: 2016-08-13 | Discharge: 2016-08-13 | Disposition: A | Discharge status: Home / Self Care | Payer: Medicaid Other | Attending: Emergency Medicine | Admitting: Emergency Medicine

## 2016-08-13 DIAGNOSIS — Y929 Unspecified place or not applicable: Secondary | ICD-10-CM | POA: Insufficient documentation

## 2016-08-13 DIAGNOSIS — R202 Paresthesia of skin: Secondary | ICD-10-CM

## 2016-08-13 DIAGNOSIS — W273XXA Contact with needle (sewing), initial encounter: Secondary | ICD-10-CM | POA: Insufficient documentation

## 2016-08-13 DIAGNOSIS — Z87891 Personal history of nicotine dependence: Secondary | ICD-10-CM | POA: Insufficient documentation

## 2016-08-13 DIAGNOSIS — Y9389 Activity, other specified: Secondary | ICD-10-CM | POA: Insufficient documentation

## 2016-08-13 DIAGNOSIS — Y990 Civilian activity done for income or pay: Secondary | ICD-10-CM | POA: Insufficient documentation

## 2016-08-13 DIAGNOSIS — R200 Anesthesia of skin: Secondary | ICD-10-CM

## 2016-08-13 DIAGNOSIS — S51831A Puncture wound without foreign body of right forearm, initial encounter: Secondary | ICD-10-CM | POA: Insufficient documentation

## 2016-08-13 DIAGNOSIS — E119 Type 2 diabetes mellitus without complications: Secondary | ICD-10-CM | POA: Insufficient documentation

## 2016-08-13 MED ORDER — TETANUS-DIPHTH-ACELL PERTUSSIS 5-2.5-18.5 LF-MCG/0.5 IM SUSP
0.5000 mL | Freq: Once | INTRAMUSCULAR | Status: AC
  Administered 2016-08-13: 0.5 mL via INTRAMUSCULAR
  Filled 2016-08-13: qty 0.5

## 2016-08-13 MED ORDER — MELOXICAM 15 MG PO TABS: 15.0000 mg | ORAL_TABLET | Freq: Every day | ORAL | 2 refills | Status: AC

## 2016-08-13 NOTE — Discharge Instructions (Signed)
Call and make an appointment with the orthopedist listed on her papers. Begin taking meloxicam daily with food. Watch puncture wound for any signs of infection. Return to the emergency room if any severe worsening of your symptoms.

## 2016-08-13 NOTE — ED Notes (Signed)
See triage note  States she was working and had an approximate 4 inch needle go into right forearm   Was able to pull out the needle  States needle was straight  No hook  This happened on Friday   And today has numbness from forearm into hand
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
## 2018-06-21 ENCOUNTER — Emergency Department
Admission: EM | Admit: 2018-06-21 | Discharge: 2018-06-21 | Disposition: A | Discharge status: Home / Self Care | Payer: Self-pay | Attending: Emergency Medicine | Admitting: Emergency Medicine

## 2018-06-21 ENCOUNTER — Encounter: Payer: Self-pay | Admitting: Emergency Medicine

## 2018-06-21 DIAGNOSIS — L720 Epidermal cyst: Secondary | ICD-10-CM | POA: Insufficient documentation

## 2018-06-21 DIAGNOSIS — Z87891 Personal history of nicotine dependence: Secondary | ICD-10-CM | POA: Insufficient documentation

## 2018-06-21 DIAGNOSIS — E119 Type 2 diabetes mellitus without complications: Secondary | ICD-10-CM | POA: Insufficient documentation

## 2018-06-21 MED ORDER — AMOXICILLIN 500 MG PO CAPS: 500.0000 mg | ORAL_CAPSULE | Freq: Three times a day (TID) | ORAL | 0 refills | Status: DC
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
## 2018-06-21 NOTE — Discharge Instructions (Addendum)
Follow-up with your regular doctor or [place] [facility] if not better in 5 to 7 days.  Use medication as prescribed.  The cyst is enlarging and becoming worse please return the emergency department

## 2018-06-21 NOTE — ED Triage Notes (Signed)
Patient presents to the ED with sore throat x 2 days.  Patient reports pain with swallowing and talking.  Patient speaking in full sentences without difficulty at this time.  Patient states she feels like her throat is swollen.  Patient is in no obvious distress.

## 2018-12-16 ENCOUNTER — Emergency Department
Admission: EM | Admit: 2018-12-16 | Discharge: 2018-12-16 | Disposition: A | Discharge status: Home / Self Care | Payer: BLUE CROSS/BLUE SHIELD | Attending: Emergency Medicine | Admitting: Emergency Medicine

## 2018-12-16 ENCOUNTER — Other Ambulatory Visit: Payer: Self-pay

## 2018-12-16 ENCOUNTER — Emergency Department: Payer: BLUE CROSS/BLUE SHIELD

## 2018-12-16 ENCOUNTER — Encounter: Payer: Self-pay | Admitting: Emergency Medicine

## 2018-12-16 DIAGNOSIS — R05 Cough: Secondary | ICD-10-CM | POA: Insufficient documentation

## 2018-12-16 DIAGNOSIS — R0602 Shortness of breath: Secondary | ICD-10-CM | POA: Insufficient documentation

## 2018-12-16 DIAGNOSIS — J040 Acute laryngitis: Secondary | ICD-10-CM | POA: Insufficient documentation

## 2018-12-16 DIAGNOSIS — Z87891 Personal history of nicotine dependence: Secondary | ICD-10-CM | POA: Diagnosis not present

## 2018-12-16 DIAGNOSIS — R059 Cough, unspecified: Secondary | ICD-10-CM

## 2018-12-16 DIAGNOSIS — E119 Type 2 diabetes mellitus without complications: Secondary | ICD-10-CM | POA: Diagnosis not present

## 2018-12-16 DIAGNOSIS — R490 Dysphonia: Secondary | ICD-10-CM | POA: Diagnosis present

## 2018-12-16 LAB — CBC
HEMATOCRIT: 42.4 % (ref 36.0–46.0)
HEMOGLOBIN: 14.2 g/dL (ref 12.0–15.0)
MCH: 29.9 pg (ref 26.0–34.0)
MCHC: 33.5 g/dL (ref 30.0–36.0)
MCV: 89.3 fL (ref 80.0–100.0)
Platelets: 218 10*3/uL (ref 150–400)
RBC: 4.75 MIL/uL (ref 3.87–5.11)
RDW: 12.6 % (ref 11.5–15.5)
WBC: 9.6 10*3/uL (ref 4.0–10.5)
nRBC: 0 % (ref 0.0–0.2)

## 2018-12-16 LAB — TROPONIN I: Troponin I: <0.03 ng/mL (ref <0.03)

## 2018-12-16 LAB — INFLUENZA PANEL BY PCR (TYPE A & B)
INFLBPCR: NEGATIVE
Influenza A By PCR: NEGATIVE

## 2018-12-16 LAB — BASIC METABOLIC PANEL
Anion gap: 9 (ref 5–15)
BUN: 13 mg/dL (ref 6–20)
CO2: 24 mmol/L (ref 22–32)
Calcium: 9.4 mg/dL (ref 8.9–10.3)
Chloride: 106 mmol/L (ref 98–111)
Creatinine, Ser: 0.7 mg/dL (ref 0.44–1.00)
GFR calc Af Amer: >60 mL/min (ref >60)
GLUCOSE: 84 mg/dL (ref 70–99)
Potassium: 3.7 mmol/L (ref 3.5–5.1)
Sodium: 139 mmol/L (ref 135–145)

## 2018-12-16 LAB — POCT PREGNANCY, URINE: Preg Test, Ur: NEGATIVE

## 2018-12-16 MED ORDER — BENZONATATE 100 MG PO CAPS: 100.0000 mg | ORAL_CAPSULE | Freq: Four times a day (QID) | ORAL | 0 refills | Status: DC | PRN

## 2018-12-16 NOTE — Discharge Instructions (Addendum)
Drink plenty of fluid to stay well-hydrated.  You may take Tylenol or Motrin for fever or pain.  Please turn to the emergency department if you develop severe pain, increasing shortness of breath, lightheadedness or fainting, or any other symptoms concerning to you.
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
## 2018-12-16 NOTE — ED Triage Notes (Addendum)
Pt arrived with complaints of shortness of breath. Pt states exertion intensifies pt's shortness of breath. Pt's lungs clear and breathing unlabored in triage. Cough present. Pt denies sputum production. PT also reports chest pressure.

## 2018-12-16 NOTE — ED Notes (Signed)
Pt to the hospital for a cough x 1 week. With a nonproductive cough. Pt then states if she gags herself she can get it to come up. Pt taking tylenol cold at home with no relief. Pt took only 1 dose of muccinex over the last week. Pt did not get a flu shot this year. Lung sounds are clear. No wheezes heard. No sinus drainage. No hx of asthma and pt is a nonsmoker.

## 2019-03-04 ENCOUNTER — Encounter: Payer: Self-pay | Admitting: Emergency Medicine

## 2019-03-04 ENCOUNTER — Other Ambulatory Visit: Payer: Self-pay

## 2019-03-04 DIAGNOSIS — F17200 Nicotine dependence, unspecified, uncomplicated: Secondary | ICD-10-CM | POA: Diagnosis not present

## 2019-03-04 DIAGNOSIS — Z79899 Other long term (current) drug therapy: Secondary | ICD-10-CM | POA: Diagnosis not present

## 2019-03-04 DIAGNOSIS — K8020 Calculus of gallbladder without cholecystitis without obstruction: Secondary | ICD-10-CM | POA: Insufficient documentation

## 2019-03-04 DIAGNOSIS — E119 Type 2 diabetes mellitus without complications: Secondary | ICD-10-CM | POA: Diagnosis not present

## 2019-03-04 DIAGNOSIS — R1031 Right lower quadrant pain: Secondary | ICD-10-CM | POA: Diagnosis present

## 2019-03-04 NOTE — ED Triage Notes (Signed)
Patient ambulatory to triage with steady gait, without difficulty or distress noted; pt reports right lower abd pain since last night accomp by nausea

## 2019-03-05 ENCOUNTER — Emergency Department (HOSPITAL_COMMUNITY)
Admission: EM | Admit: 2019-03-05 | Discharge: 2019-03-05 | Disposition: A | Discharge status: Home / Self Care | Payer: BLUE CROSS/BLUE SHIELD | Attending: Emergency Medicine | Admitting: Emergency Medicine

## 2019-03-05 ENCOUNTER — Encounter (HOSPITAL_COMMUNITY): Payer: Self-pay

## 2019-03-05 ENCOUNTER — Telehealth: Payer: Self-pay

## 2019-03-05 ENCOUNTER — Other Ambulatory Visit: Payer: Self-pay

## 2019-03-05 ENCOUNTER — Emergency Department: Payer: BLUE CROSS/BLUE SHIELD

## 2019-03-05 ENCOUNTER — Emergency Department
Admission: EM | Admit: 2019-03-05 | Discharge: 2019-03-05 | Disposition: A | Discharge status: Home / Self Care | Payer: BLUE CROSS/BLUE SHIELD | Attending: Emergency Medicine | Admitting: Emergency Medicine

## 2019-03-05 DIAGNOSIS — Z5321 Procedure and treatment not carried out due to patient leaving prior to being seen by health care provider: Secondary | ICD-10-CM | POA: Diagnosis not present

## 2019-03-05 DIAGNOSIS — R109 Unspecified abdominal pain: Secondary | ICD-10-CM | POA: Diagnosis present

## 2019-03-05 DIAGNOSIS — K8020 Calculus of gallbladder without cholecystitis without obstruction: Secondary | ICD-10-CM

## 2019-03-05 LAB — CBC WITH DIFFERENTIAL/PLATELET
Abs Immature Granulocytes: 0.03 10*3/uL (ref 0.00–0.07)
Basophils Absolute: 0.1 10*3/uL (ref 0.0–0.1)
Basophils Relative: 1 %
Eosinophils Absolute: 0.1 10*3/uL (ref 0.0–0.5)
Eosinophils Relative: 2 %
HCT: 41.9 % (ref 36.0–46.0)
Hemoglobin: 13.7 g/dL (ref 12.0–15.0)
Immature Granulocytes: 0 %
Lymphocytes Relative: 29 %
Lymphs Abs: 2.3 10*3/uL (ref 0.7–4.0)
MCH: 29.3 pg (ref 26.0–34.0)
MCHC: 32.7 g/dL (ref 30.0–36.0)
MCV: 89.5 fL (ref 80.0–100.0)
Monocytes Absolute: 0.7 10*3/uL (ref 0.1–1.0)
Monocytes Relative: 9 %
Neutro Abs: 4.8 10*3/uL (ref 1.7–7.7)
Neutrophils Relative %: 59 %
Platelets: 194 10*3/uL (ref 150–400)
RBC: 4.68 MIL/uL (ref 3.87–5.11)
RDW: 12.5 % (ref 11.5–15.5)
WBC: 7.9 10*3/uL (ref 4.0–10.5)
nRBC: 0 % (ref 0.0–0.2)

## 2019-03-05 LAB — URINALYSIS, COMPLETE (UACMP) WITH MICROSCOPIC
Bilirubin Urine: NEGATIVE
Glucose, UA: NEGATIVE mg/dL
Hgb urine dipstick: NEGATIVE
Ketones, ur: 5 mg/dL — AB
Nitrite: NEGATIVE
Protein, ur: NEGATIVE mg/dL
Specific Gravity, Urine: 1.027 (ref 1.005–1.030)
pH: 6 (ref 5.0–8.0)

## 2019-03-05 LAB — COMPREHENSIVE METABOLIC PANEL
ALBUMIN: 4.1 g/dL (ref 3.5–5.0)
ALT: 21 U/L (ref 0–44)
AST: 23 U/L (ref 15–41)
Alkaline Phosphatase: 51 U/L (ref 38–126)
Anion gap: 9 (ref 5–15)
BUN: 15 mg/dL (ref 6–20)
CO2: 24 mmol/L (ref 22–32)
Calcium: 9.2 mg/dL (ref 8.9–10.3)
Chloride: 107 mmol/L (ref 98–111)
Creatinine, Ser: 0.71 mg/dL (ref 0.44–1.00)
GFR calc Af Amer: >60 mL/min (ref >60)
GFR calc non Af Amer: >60 mL/min (ref >60)
Glucose, Bld: 110 mg/dL — ABNORMAL HIGH (ref 70–99)
Potassium: 3.9 mmol/L (ref 3.5–5.1)
Sodium: 140 mmol/L (ref 135–145)
Total Bilirubin: 0.6 mg/dL (ref 0.3–1.2)
Total Protein: 7.5 g/dL (ref 6.5–8.1)

## 2019-03-05 LAB — LIPASE, BLOOD: Lipase: 36 U/L (ref 11–51)

## 2019-03-05 LAB — POCT PREGNANCY, URINE: Preg Test, Ur: NEGATIVE

## 2019-03-05 MED ORDER — CEPHALEXIN 500 MG PO CAPS
500.0000 mg | ORAL_CAPSULE | Freq: Once | ORAL | Status: AC
  Administered 2019-03-05: 500 mg via ORAL
  Filled 2019-03-05: qty 1

## 2019-03-05 MED ORDER — IOHEXOL 300 MG/ML  SOLN
100.0000 mL | Freq: Once | INTRAMUSCULAR | Status: AC | PRN
  Administered 2019-03-05: 100 mL via INTRAVENOUS

## 2019-03-05 NOTE — ED Triage Notes (Signed)
Pt reports she has been having abdominal pain X2 days. States she was seen at [facility] and told it was her gall bladder. No distress noted. Pt also reports nausea and constipation.

## 2019-03-05 NOTE — Telephone Encounter (Signed)
Patient's mother called and stated patient is having pain 8 on scale and CT showed Cholelithiasis. Patient was instructed to go to Emergency room and nothing by mouth in case surgery is warranted.   Patient has appointment scheduled on Tuesday with Dr.Piscoya.

## 2019-03-05 NOTE — ED Notes (Signed)
No answer for vitals recheck
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
## 2019-03-09 ENCOUNTER — Ambulatory Visit (INDEPENDENT_AMBULATORY_CARE_PROVIDER_SITE_OTHER): Payer: BLUE CROSS/BLUE SHIELD | Admitting: Surgery

## 2019-03-09 ENCOUNTER — Encounter: Payer: Self-pay | Admitting: Surgery

## 2019-03-09 ENCOUNTER — Ambulatory Visit: Payer: Self-pay | Admitting: Surgery

## 2019-03-09 ENCOUNTER — Telehealth: Payer: Self-pay | Admitting: Surgery

## 2019-03-09 ENCOUNTER — Other Ambulatory Visit: Payer: Self-pay

## 2019-03-09 VITALS — BP 123/85 | HR 84 | Temp 96.6°F | Resp 16 | Ht 62.0 in | Wt 221.0 lb

## 2019-03-09 DIAGNOSIS — K8020 Calculus of gallbladder without cholecystitis without obstruction: Secondary | ICD-10-CM

## 2019-03-09 NOTE — Telephone Encounter (Signed)
Pt advised of pre op date/time and sx date. Sx: 03/11/19 with Dr Piscoya-laparoscopic cholecystectomy.  Pre op: Patient informed to arrive 2 hours prior to scheduled surgery-7:30am arrival time-[facility]-Registration desk. Patient informed to be NPO after midnight.  Patient understands all instructions and was advised to call the office should she have any questions.   Patient aware of email from [facility] sent.
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
## 2019-03-09 NOTE — Patient Instructions (Addendum)
You have requested for your Umbilical Hernia be repaired. This has been scheduled on 03/11/2019   by Dr Henrene DodgeJose Piscoya  at [facility].  Please see your (blue)pre-care sheet for information.  You will need to arrange to be off work for 1-2 weeks but will have to have a lifting restriction of no more than 15 lbs for 6 weeks following your surgery.   Umbilical Hernia, Adult A hernia is a bulge of tissue that pushes through an opening between muscles. An umbilical hernia happens in the abdomen, near the belly button (umbilicus). The hernia may contain tissues from the small intestine, large intestine, or fatty tissue covering the intestines (omentum). Umbilical hernias in adults tend to get worse over time, and they require surgical treatment. There are several types of umbilical hernias. You may have:  A hernia located just above or below the umbilicus (indirect hernia). This is the most common type of umbilical hernia in adults.  A hernia that forms through an opening formed by the umbilicus (direct hernia).  A hernia that comes and goes (reducible hernia). A reducible hernia may be visible only when you strain, lift something heavy, or cough. This type of hernia can be pushed back into the abdomen (reduced).  A hernia that traps abdominal tissue inside the hernia (incarcerated hernia). This type of hernia cannot be reduced.  A hernia that cuts off blood flow to the tissues inside the hernia (strangulated hernia). The tissues can start to die if this happens. This type of hernia requires emergency treatment.  What are the causes? An umbilical hernia happens when tissue inside the abdomen presses on a weak area of the abdominal muscles. What increases the risk? You may have a greater risk of this condition if you:  Are obese.  Have had several pregnancies.  Have a buildup of fluid inside your abdomen (ascites).  Have had surgery that weakens the abdominal muscles.  What are the  signs or symptoms? The main symptom of this condition is a painless bulge at or near the belly button. A reducible hernia may be visible only when you strain, lift something heavy, or cough. Other symptoms may include:  Dull pain.  A feeling of pressure.  Symptoms of a strangulated hernia may include:  Pain that gets increasingly worse.  Nausea and vomiting.  Pain when pressing on the hernia.  Skin over the hernia becoming red or purple.  Constipation.  Blood in the stool.  How is this diagnosed? This condition may be diagnosed based on:  A physical exam. You may be asked to cough or strain while standing. These actions increase the pressure inside your abdomen and force the hernia through the opening in your muscles. Your health care provider may try to reduce the hernia by pressing on it.  Your symptoms and medical history.  How is this treated? Surgery is the only treatment for an umbilical hernia. Surgery for a strangulated hernia is done as soon as possible. If you have a small hernia that is not incarcerated, you may need to lose weight before having surgery. Follow these instructions at home:  Lose weight, if told by your health care provider.  Do not try to push the hernia back in.  Watch your hernia for any changes in color or size. Tell your health care provider if any changes occur.  You may need to avoid activities that increase pressure on your hernia.  Do not lift anything that is heavier than 10 lb (4.5 kg)  until your health care provider says that this is safe.  Take over-the-counter and prescription medicines only as told by your health care provider.  Keep all follow-up visits as told by your health care provider. This is important. Contact a health care provider if:  Your hernia gets larger.  Your hernia becomes painful. Get help right away if:  You develop sudden, severe pain near the area of your hernia.  You have pain as well as nausea or  vomiting.  You have pain and the skin over your hernia changes color.  You develop a fever. This information is not intended to replace advice given to you by your health care provider. Make sure you discuss any questions you have with your health care provider. Document Released: 05/17/2016 Document Revised: 08/18/2016 Document Reviewed: 05/17/2016 Elsevier Interactive Patient Education  2018 [employer].        You have requested to have your gallbladder removed. This will be done 03/11/2019  at [facility] with Dr. Henrene Dodge.  You will most likely be out of work 1-2 weeks for this surgery. You will return after your post-op appointment with a lifting restriction for approximately 4 more weeks.  You will be able to eat anything you would like to following surgery. But, start by eating a bland diet and advance this as tolerated. The Gallbladder diet is below, please go as closely by this diet as possible prior to surgery to avoid any further attacks.  Please see the (blue)pre-care form that you have been given today. If you have any questions, please call our office.  Laparoscopic Cholecystectomy Laparoscopic cholecystectomy is surgery to remove the gallbladder. The gallbladder is located in the upper right part of the abdomen, behind the liver. It is a storage sac for bile, which is produced in the liver. Bile aids in the digestion and absorption of fats. Cholecystectomy is often done for inflammation of the gallbladder (cholecystitis). This condition is usually caused by a buildup of gallstones (cholelithiasis) in the gallbladder. Gallstones can block the flow of bile, and that can result in inflammation and pain. In severe cases, emergency surgery may be required. If emergency surgery is not required, you will have time to prepare for the procedure. Laparoscopic surgery is an alternative to open surgery. Laparoscopic surgery has a shorter recovery time. Your common bile duct  may also need to be examined during the procedure. If stones are found in the common bile duct, they may be removed. LET [facility] CARE PROVIDER KNOW ABOUT:  Any allergies you have.  All medicines you are taking, including vitamins, herbs, eye drops, creams, and over-the-counter medicines.  Previous problems you or members of your family have had with the use of anesthetics.  Any blood disorders you have.  Previous surgeries you have had.    Any medical conditions you have. RISKS AND COMPLICATIONS Generally, this is a safe procedure. However, problems may occur, including:  Infection.  Bleeding.  Allergic reactions to medicines.  Damage to other structures or organs.  A stone remaining in the common bile duct.  A bile leak from the cyst duct that is clipped when your gallbladder is removed.  The need to convert to open surgery, which requires a larger incision in the abdomen. This may be necessary if your surgeon thinks that it is not safe to continue with a laparoscopic procedure. BEFORE THE PROCEDURE  Ask your health care provider about:  Changing or stopping your regular medicines. This is especially important if  you are taking diabetes medicines or blood thinners.  Taking medicines such as aspirin and ibuprofen. These medicines can thin your blood. Do not take these medicines before your procedure if your health care provider instructs you not to.  Follow instructions from your health care provider about eating or drinking restrictions.  Let your health care provider know if you develop a cold or an infection before surgery.  Plan to have someone take you home after the procedure.  Ask your health care provider how your surgical site will be marked or identified.  You may be given antibiotic medicine to help prevent infection. PROCEDURE  To reduce your risk of infection:  Your health care team will wash or sanitize their hands.  Your skin will be washed  with soap.  An IV tube may be inserted into one of your veins.  You will be given a medicine to make you fall asleep (general anesthetic).  A breathing tube will be placed in your mouth.  The surgeon will make several small cuts (incisions) in your abdomen.  A thin, lighted tube (laparoscope) that has a tiny camera on the end will be inserted through one of the small incisions. The camera on the laparoscope will send a picture to a TV screen (monitor) in the operating room. This will give the surgeon a good view inside your abdomen.  A gas will be pumped into your abdomen. This will expand your abdomen to give the surgeon more room to perform the surgery.  Other tools that are needed for the procedure will be inserted through the other incisions. The gallbladder will be removed through one of the incisions.  After your gallbladder has been removed, the incisions will be closed with stitches (sutures), staples, or skin glue.  Your incisions may be covered with a bandage (dressing). The procedure may vary among health care providers and hospitals. AFTER THE PROCEDURE  Your blood pressure, heart rate, breathing rate, and blood oxygen level will be monitored often until the medicines you were given have worn off.  You will be given medicines as needed to control your pain.   This information is not intended to replace advice given to you by your health care provider. Make sure you discuss any questions you have with your health care provider.   Document Released: 12/16/2005 Document Revised: 09/06/2015 Document Reviewed: 07/28/2013 Elsevier Interactive Patient Education 2016 Elsevier Inc.   Low-Fat Diet for Gallbladder Conditions A low-fat diet can be helpful if you have pancreatitis or a gallbladder condition. With these conditions, your pancreas and gallbladder have trouble digesting fats. A healthy eating plan with less fat will help rest your pancreas and gallbladder and reduce your  symptoms. WHAT DO I NEED TO KNOW ABOUT THIS DIET?  Eat a low-fat diet.  Reduce your fat intake to less than 20-30% of your total daily calories. This is less than 50-60 g of fat per day.  Remember that you need some fat in your diet. Ask your dietician what your daily goal should be.  Choose nonfat and low-fat healthy foods. Look for the words "nonfat," "low fat," or "fat free."  As a guide, look on the label and choose foods with less than 3 g of fat per serving. Eat only one serving.  Avoid alcohol.  Do not smoke. If you need help quitting, talk with your health care provider.  Eat small frequent meals instead of three large heavy meals. WHAT FOODS CAN I EAT? Grains Include healthy grains and  starches such as potatoes, wheat bread, fiber-rich cereal, and brown rice. Choose whole grain options whenever possible. In adults, whole grains should account for 45-65% of your daily calories.  Fruits and Vegetables Eat plenty of fruits and vegetables. Fresh fruits and vegetables add fiber to your diet. Meats and Other Protein Sources Eat lean meat such as chicken and pork. Trim any fat off of meat before cooking it. Eggs, fish, and beans are other sources of protein. In adults, these foods should account for 10-35% of your daily calories. Dairy Choose low-fat milk and dairy options. Dairy includes fat and protein, as well as calcium.  Fats and Oils Limit high-fat foods such as fried foods, sweets, baked goods, sugary drinks.  Other Creamy sauces and condiments, such as mayonnaise, can add extra fat. Think about whether or not you need to use them, or use smaller amounts or low fat options. WHAT FOODS ARE NOT RECOMMENDED?  High fat foods, such as:  [employer].  Ice cream.  [place] toast.  Sweet rolls.  Pizza.  Cheese bread.  Foods covered with batter, butter, creamy sauces, or cheese.  Fried foods.  Sugary drinks and desserts.  Foods that cause gas or bloating   This  information is not intended to replace advice given to you by your health care provider. Make sure you discuss any questions you have with your health care provider.   Document Released: 12/21/2013 Document Reviewed: 12/21/2013 Elsevier Interactive Patient Education [employer]
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
## 2019-03-10 ENCOUNTER — Telehealth: Payer: Self-pay | Admitting: *Deleted

## 2019-03-10 MED ORDER — CEFAZOLIN SODIUM-DEXTROSE 2-4 GM/100ML-% IV SOLN
2.0000 g | INTRAVENOUS | Status: AC
  Administered 2019-03-11: 2 g via INTRAVENOUS

## 2019-03-10 MED ORDER — CHLORHEXIDINE GLUCONATE CLOTH 2 % EX PADS: 6.0000 | MEDICATED_PAD | Freq: Once | CUTANEOUS | Status: AC

## 2019-03-10 NOTE — Telephone Encounter (Signed)
She called asking about the chlorhexidine prep she is to do the night before surgery. Pt aware to pick up from pharmacy. This is over the counter per [facility].

## 2019-03-10 NOTE — Addendum Note (Signed)
Addended by: Myrtie Hawk on: 03/10/2019 12:26 PM   Modules accepted: Orders

## 2019-03-11 ENCOUNTER — Encounter
Admission: RE | Disposition: A | Discharge status: Home / Self Care | Payer: Self-pay | Source: Home / Self Care | Attending: Surgery

## 2019-03-11 ENCOUNTER — Ambulatory Visit: Payer: BLUE CROSS/BLUE SHIELD | Admitting: Anesthesiology

## 2019-03-11 ENCOUNTER — Encounter: Payer: Self-pay | Admitting: Surgery

## 2019-03-11 ENCOUNTER — Ambulatory Visit
Admission: RE | Admit: 2019-03-11 | Discharge: 2019-03-11 | Disposition: A | Discharge status: Home / Self Care | Payer: BLUE CROSS/BLUE SHIELD | Attending: Surgery | Admitting: Surgery

## 2019-03-11 DIAGNOSIS — Z6841 Body Mass Index (BMI) 40.0 and over, adult: Secondary | ICD-10-CM | POA: Insufficient documentation

## 2019-03-11 DIAGNOSIS — K8020 Calculus of gallbladder without cholecystitis without obstruction: Secondary | ICD-10-CM

## 2019-03-11 DIAGNOSIS — Z87891 Personal history of nicotine dependence: Secondary | ICD-10-CM | POA: Insufficient documentation

## 2019-03-11 DIAGNOSIS — K429 Umbilical hernia without obstruction or gangrene: Secondary | ICD-10-CM | POA: Diagnosis not present

## 2019-03-11 DIAGNOSIS — K8010 Calculus of gallbladder with chronic cholecystitis without obstruction: Secondary | ICD-10-CM | POA: Diagnosis not present

## 2019-03-11 HISTORY — PX: CHOLECYSTECTOMY: SHX55

## 2019-03-11 LAB — POCT PREGNANCY, URINE: Preg Test, Ur: NEGATIVE

## 2019-03-11 SURGERY — LAPAROSCOPIC CHOLECYSTECTOMY
Anesthesia: Choice

## 2019-03-11 SURGERY — LAPAROSCOPIC CHOLECYSTECTOMY
Anesthesia: General

## 2019-03-11 MED ORDER — GABAPENTIN 300 MG PO CAPS
ORAL_CAPSULE | ORAL | Status: AC
  Filled 2019-03-11: qty 1

## 2019-03-11 MED ORDER — OXYCODONE HCL 5 MG PO TABS: 5.0000 mg | ORAL_TABLET | ORAL | 0 refills | Status: DC | PRN

## 2019-03-11 MED ORDER — ESMOLOL HCL 100 MG/10ML IV SOLN
INTRAVENOUS | Status: AC
  Filled 2019-03-11: qty 10

## 2019-03-11 MED ORDER — LIDOCAINE HCL (CARDIAC) PF 100 MG/5ML IV SOSY
PREFILLED_SYRINGE | INTRAVENOUS | Status: DC | PRN
  Administered 2019-03-11: 100 mg via INTRAVENOUS

## 2019-03-11 MED ORDER — FAMOTIDINE 20 MG PO TABS
ORAL_TABLET | ORAL | Status: AC
  Filled 2019-03-11: qty 1

## 2019-03-11 MED ORDER — FENTANYL CITRATE (PF) 100 MCG/2ML IJ SOLN
INTRAMUSCULAR | Status: DC | PRN
  Administered 2019-03-11 (×2): 50 ug via INTRAVENOUS

## 2019-03-11 MED ORDER — ROCURONIUM BROMIDE 100 MG/10ML IV SOLN
INTRAVENOUS | Status: DC | PRN
  Administered 2019-03-11: 20 mg via INTRAVENOUS
  Administered 2019-03-11: 30 mg via INTRAVENOUS
  Administered 2019-03-11: 50 mg via INTRAVENOUS

## 2019-03-11 MED ORDER — MIDAZOLAM HCL 2 MG/2ML IJ SOLN
INTRAMUSCULAR | Status: AC
  Filled 2019-03-11: qty 2

## 2019-03-11 MED ORDER — FENTANYL CITRATE (PF) 100 MCG/2ML IJ SOLN
INTRAMUSCULAR | Status: AC
  Filled 2019-03-11: qty 2

## 2019-03-11 MED ORDER — CEFAZOLIN SODIUM-DEXTROSE 2-4 GM/100ML-% IV SOLN
INTRAVENOUS | Status: AC
  Filled 2019-03-11: qty 100

## 2019-03-11 MED ORDER — IBUPROFEN 600 MG PO TABS: 600.0000 mg | ORAL_TABLET | Freq: Three times a day (TID) | ORAL | 0 refills | Status: DC | PRN

## 2019-03-11 MED ORDER — FAMOTIDINE 20 MG PO TABS: 20.0000 mg | ORAL_TABLET | Freq: Once | ORAL | Status: DC

## 2019-03-11 MED ORDER — PHENYLEPHRINE HCL 10 MG/ML IJ SOLN
INTRAMUSCULAR | Status: DC | PRN
  Administered 2019-03-11 (×2): 100 ug via INTRAVENOUS

## 2019-03-11 MED ORDER — MIDAZOLAM HCL 2 MG/2ML IJ SOLN
INTRAMUSCULAR | Status: DC | PRN
  Administered 2019-03-11: 2 mg via INTRAVENOUS

## 2019-03-11 MED ORDER — DEXAMETHASONE SODIUM PHOSPHATE 10 MG/ML IJ SOLN
INTRAMUSCULAR | Status: DC | PRN
  Administered 2019-03-11: 10 mg via INTRAVENOUS

## 2019-03-11 MED ORDER — HYDROMORPHONE HCL 1 MG/ML IJ SOLN
INTRAMUSCULAR | Status: AC
  Administered 2019-03-11: 0.25 mg via INTRAVENOUS
  Filled 2019-03-11: qty 1

## 2019-03-11 MED ORDER — SUGAMMADEX SODIUM 500 MG/5ML IV SOLN
INTRAVENOUS | Status: DC | PRN
  Administered 2019-03-11: 300 mg via INTRAVENOUS

## 2019-03-11 MED ORDER — KETOROLAC TROMETHAMINE 30 MG/ML IJ SOLN
INTRAMUSCULAR | Status: DC | PRN
  Administered 2019-03-11: 30 mg via INTRAVENOUS

## 2019-03-11 MED ORDER — BUPIVACAINE-EPINEPHRINE (PF) 0.25% -1:200000 IJ SOLN
INTRAMUSCULAR | Status: DC | PRN
  Administered 2019-03-11: 30 mL via PERINEURAL

## 2019-03-11 MED ORDER — ACETAMINOPHEN 500 MG PO TABS
ORAL_TABLET | ORAL | Status: AC
  Filled 2019-03-11: qty 2

## 2019-03-11 MED ORDER — ACETAMINOPHEN 500 MG PO TABS: 1000.0000 mg | ORAL_TABLET | ORAL | Status: DC

## 2019-03-11 MED ORDER — PROPOFOL 10 MG/ML IV BOLUS
INTRAVENOUS | Status: AC
  Filled 2019-03-11: qty 20

## 2019-03-11 MED ORDER — GLYCOPYRROLATE 0.2 MG/ML IJ SOLN
INTRAMUSCULAR | Status: DC | PRN
  Administered 2019-03-11: 0.2 mg via INTRAVENOUS

## 2019-03-11 MED ORDER — PROPOFOL 10 MG/ML IV BOLUS
INTRAVENOUS | Status: DC | PRN
  Administered 2019-03-11: 160 mg via INTRAVENOUS

## 2019-03-11 MED ORDER — HYDROMORPHONE HCL 1 MG/ML IJ SOLN
0.2500 mg | INTRAMUSCULAR | Status: DC | PRN
  Administered 2019-03-11 (×2): 0.25 mg via INTRAVENOUS

## 2019-03-11 MED ORDER — ONDANSETRON HCL 4 MG/2ML IJ SOLN
INTRAMUSCULAR | Status: AC
  Filled 2019-03-11: qty 2

## 2019-03-11 MED ORDER — SCOPOLAMINE 1 MG/3DAYS TD PT72
MEDICATED_PATCH | TRANSDERMAL | Status: AC
  Filled 2019-03-11: qty 1

## 2019-03-11 MED ORDER — GABAPENTIN 300 MG PO CAPS: 300.0000 mg | ORAL_CAPSULE | ORAL | Status: DC

## 2019-03-11 MED ORDER — LACTATED RINGERS IV SOLN
INTRAVENOUS | Status: DC
  Administered 2019-03-11 (×2): via INTRAVENOUS

## 2019-03-11 MED ORDER — ONDANSETRON HCL 4 MG/2ML IJ SOLN
INTRAMUSCULAR | Status: DC | PRN
  Administered 2019-03-11 (×2): 4 mg via INTRAVENOUS

## 2019-03-11 SURGICAL SUPPLY — 42 items
APPLIER CLIP 5 13 M/L LIGAMAX5 (MISCELLANEOUS) ×3
BLADE SURG 15 STRL LF DISP TIS (BLADE) ×1 IMPLANT
BLADE SURG 15 STRL SS (BLADE) ×2
CANISTER SUCT 1200ML W/VALVE (MISCELLANEOUS) ×3 IMPLANT
CATH CHOLANGI 4FR 420404F (CATHETERS) IMPLANT
CHLORAPREP W/TINT 26 (MISCELLANEOUS) ×3 IMPLANT
CLIP APPLIE 5 13 M/L LIGAMAX5 (MISCELLANEOUS) ×1 IMPLANT
CONRAY 60ML FOR OR (MISCELLANEOUS) IMPLANT
COVER WAND RF STERILE (DRAPES) IMPLANT
DERMABOND ADVANCED (GAUZE/BANDAGES/DRESSINGS) ×2
DERMABOND ADVANCED .7 DNX12 (GAUZE/BANDAGES/DRESSINGS) ×1 IMPLANT
DRAPE C-ARM XRAY 36X54 (DRAPES) IMPLANT
ELECT REM PT RETURN 9FT ADLT (ELECTROSURGICAL) ×3
ELECTRODE REM PT RTRN 9FT ADLT (ELECTROSURGICAL) ×1 IMPLANT
GLOVE SURG SYN 7.0 (GLOVE) ×3 IMPLANT
GLOVE SURG SYN 7.5  E (GLOVE) ×2
GLOVE SURG SYN 7.5 E (GLOVE) ×1 IMPLANT
GOWN STRL REUS W/ TWL LRG LVL3 (GOWN DISPOSABLE) ×3 IMPLANT
GOWN STRL REUS W/TWL LRG LVL3 (GOWN DISPOSABLE) ×6
IRRIGATION STRYKERFLOW (MISCELLANEOUS) IMPLANT
IRRIGATOR STRYKERFLOW (MISCELLANEOUS)
IV CATH ANGIO 12GX3 LT BLUE (NEEDLE) IMPLANT
IV NS 1000ML (IV SOLUTION)
IV NS 1000ML BAXH (IV SOLUTION) IMPLANT
JACKSON PRATT 10 (INSTRUMENTS) IMPLANT
L-HOOK LAP DISP 36CM (ELECTROSURGICAL) ×3
LABEL OR SOLS (LABEL) ×3 IMPLANT
LHOOK LAP DISP 36CM (ELECTROSURGICAL) ×1 IMPLANT
NEEDLE HYPO 22GX1.5 SAFETY (NEEDLE) ×6 IMPLANT
PACK LAP CHOLECYSTECTOMY (MISCELLANEOUS) ×3 IMPLANT
PENCIL ELECTRO HAND CTR (MISCELLANEOUS) ×3 IMPLANT
POUCH SPECIMEN RETRIEVAL 10MM (ENDOMECHANICALS) ×3 IMPLANT
SCISSORS METZENBAUM CVD 33 (INSTRUMENTS) ×3 IMPLANT
SET TUBE SMOKE EVAC HIGH FLOW (TUBING) ×3 IMPLANT
SLEEVE ADV FIXATION 5X100MM (TROCAR) ×9 IMPLANT
SPONGE VERSALON 4X4 4PLY (MISCELLANEOUS) IMPLANT
SUT MNCRL 4-0 (SUTURE) ×4
SUT MNCRL 4-0 27XMFL (SUTURE) ×2
SUT VICRYL 0 AB UR-6 (SUTURE) ×6 IMPLANT
SUTURE MNCRL 4-0 27XMF (SUTURE) ×2 IMPLANT
TROCAR BALLN GELPORT 12X130M (ENDOMECHANICALS) ×3 IMPLANT
TROCAR Z-THREAD OPTICAL 5X100M (TROCAR) ×3 IMPLANT

## 2019-03-11 NOTE — Discharge Instructions (Signed)
Laparoscopic Cholecystectomy, Care After °This sheet gives you information about how to care for yourself after your procedure. Your doctor may also give you more specific instructions. If you have problems or questions, contact your doctor. °Follow these instructions at home: °Care for cuts from surgery (incisions) ° °· Follow instructions from your doctor about how to take care of your cuts from surgery. Make sure you: °? Wash your hands with soap and water before you change your bandage (dressing). If you cannot use soap and water, use hand sanitizer. °? Change your bandage as told by your doctor. °? Leave stitches (sutures), skin glue, or skin tape (adhesive) strips in place. They may need to stay in place for 2 weeks or longer. If tape strips get loose and curl up, you may trim the loose edges. Do not remove tape strips completely unless your doctor says it is okay. °· Do not take baths, swim, or use a hot tub until your doctor says it is okay. Ask your doctor if you can take showers. You may only be allowed to take sponge baths for bathing. °· Check your surgical cut area every day for signs of infection. Check for: °? More redness, swelling, or pain. °? More fluid or blood. °? Warmth. °? Pus or a bad smell. °Activity °· Do not drive or use heavy machinery while taking prescription pain medicine. °· Do not lift anything that is heavier than 10 lb (4.5 kg) until your doctor says it is okay. °· Do not play contact sports until your doctor says it is okay. °· Do not drive for 24 hours if you were given a medicine to help you relax (sedative). °· Rest as needed. Do not return to work or school until your doctor says it is okay. °General instructions °· Take over-the-counter and prescription medicines only as told by your doctor. °· To prevent or treat constipation while you are taking prescription pain medicine, your doctor may recommend that you: °? Drink enough fluid to keep your pee (urine) clear or pale  yellow. °? Take over-the-counter or prescription medicines. °? Eat foods that are high in fiber, such as fresh fruits and vegetables, whole grains, and beans. °? Limit foods that are high in fat and processed sugars, such as fried and sweet foods. °Contact a doctor if: °· You develop a rash. °· You have more redness, swelling, or pain around your surgical cuts. °· You have more fluid or blood coming from your surgical cuts. °· Your surgical cuts feel warm to the touch. °· You have pus or a bad smell coming from your surgical cuts. °· You have a fever. °· One or more of your surgical cuts breaks open. °Get help right away if: °· You have trouble breathing. °· You have chest pain. °· You have pain that is getting worse in your shoulders. °· You faint or feel dizzy when you stand. °· You have very bad pain in your belly (abdomen). °· You are sick to your stomach (nauseous) for more than one day. °· You have throwing up (vomiting) that lasts for more than one day. °· You have leg pain. °This information is not intended to replace advice given to you by your health care provider. Make sure you discuss any questions you have with your health care provider. °Document Released: 09/24/2008 Document Revised: 07/06/2016 Document Reviewed: 06/03/2016 °Elsevier Interactive Patient Education © 2019 [employer] ° °AMBULATORY SURGERY  °DISCHARGE INSTRUCTIONS ° ° °1) The drugs that you were given   will stay in your system until tomorrow so for the next 24 hours you should not: ° °A) Drive an automobile °B) Make any legal decisions °C) Drink any alcoholic beverage ° ° °2) You may resume regular meals tomorrow.  Today it is better to start with liquids and gradually work up to solid foods. ° °You may eat anything you prefer, but it is better to start with liquids, then soup and crackers, and gradually work up to solid foods. ° ° °3) Please notify your doctor immediately if you have any unusual bleeding, trouble breathing, redness and  pain at the surgery site, drainage, fever, or pain not relieved by medication. ° ° ° °4) Additional Instructions: ° ° ° ° ° ° ° °Please contact your physician with any problems or Same Day Surgery at [phone], Monday through Friday 6 am to 4 pm, or [facility] [place] Main number at [phone]. ° °

## 2019-03-11 NOTE — Op Note (Signed)
  Procedure Date:  03/11/2019  Pre-operative Diagnosis:  Symptomatic cholelithiasis  Post-operative Diagnosis:  Symptomatic cholelithiasis and umbilical hernia  Procedure:  Laparoscopic cholecystectomy; umbilical hernia repair  Surgeon:  Howie Ill, MD  Assistant:  Loleta Dicker PA-S  Anesthesia:  General endotracheal  Estimated Blood Loss:  10 ml  Specimens:  gallbladder  Complications:  None  Indications for Procedure:  This is a 23 y.o. female who presents with abdominal pain and workup revealing symptomatic cholelithiasis.  The benefits, complications, treatment options, and expected outcomes were discussed with the patient. The risks of bleeding, infection, recurrence of symptoms, failure to resolve symptoms, bile duct damage, bile duct leak, retained common bile duct stone, bowel injury, and need for further procedures were all discussed with the patient and she was willing to proceed.  Description of Procedure: The patient was correctly identified in the preoperative area and brought into the operating room.  The patient was placed supine with VTE prophylaxis in place.  Appropriate time-outs were performed.  Anesthesia was induced and the patient was intubated.  Appropriate antibiotics were infused.  The abdomen was prepped and draped in a sterile fashion. An infraumbilical incision was made. A cutdown technique was used to enter the abdominal cavity without injury, and a Hasson trocar was inserted.  The patient was noted to also have a small umbilical hernia defect.  Pneumoperitoneum was obtained with appropriate opening pressures.  A 5-mm port was placed in the subxiphoid area and two 5-mm ports were placed in the right upper quadrant under direct visualization.  The gallbladder was identified.  The fundus was grasped and retracted cephalad.  Adhesions were lysed bluntly and with electrocautery. The infundibulum was grasped and retracted laterally, exposing the  peritoneum overlying the gallbladder.  This was incised with electrocautery and extended on either side of the gallbladder.  The cystic duct and cystic artery were clearly identified and bluntly dissected.  Both were clipped twice proximally and once distally, cutting in between.  The gallbladder was taken from the gallbladder fossa in a retrograde fashion with electrocautery. The gallbladder was placed in an Endocatch bag. The liver bed was inspected and any bleeding was controlled with electrocautery. The right upper quadrant was then inspected again revealing intact clips, no bleeding, and no ductal injury.    The 5 mm ports were removed under direct visualization and the Hasson trocar was removed.  The Endocatch bag was brought out via the umbilical incision. The fascial opening was closed using 0 vicryl suture.  The umbilical hernia was also closed using 0 vicryl suture. Local anesthetic was infused in all incisions and the incisions were closed with 4-0 Monocryl, including 3-0 Vicryl for the umbilical incision.  The wounds were cleaned and sealed with DermaBond.  The patient was emerged from anesthesia and extubated and brought to the recovery room for further management.  The patient tolerated the procedure well and all counts were correct at the end of the case.   Howie Ill, MD

## 2019-03-11 NOTE — Anesthesia Post-op Follow-up Note (Signed)
Anesthesia QCDR form completed.        

## 2019-03-11 NOTE — Transfer of Care (Signed)
Immediate Anesthesia Transfer of Care Note  Patient: Becky Blake  Procedure(s) Performed: LAPAROSCOPIC CHOLECYSTECTOMY (N/A )  Patient Location: PACU  Anesthesia Type:General  Level of Consciousness: awake, alert , oriented and patient cooperative  Airway & Oxygen Therapy: Patient Spontanous Breathing and Patient connected to face mask oxygen  Post-op Assessment: Report given to RN and Post -op Vital signs reviewed and stable  Post vital signs: Reviewed and stable  Last Vitals:  Vitals Value Taken Time  BP 105/58 03/11/2019 12:11 PM  Temp 37 C 03/11/2019 12:11 PM  Pulse 59 03/11/2019 12:16 PM  Resp 18 03/11/2019 12:16 PM  SpO2 100 % 03/11/2019 12:16 PM  Vitals shown include unvalidated device data.  Last Pain: There were no vitals filed for this visit.       Complications: No apparent anesthesia complications
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
## 2019-03-11 NOTE — Anesthesia Procedure Notes (Signed)
Procedure Name: Intubation Date/Time: 03/11/2019 10:18 AM Performed by: Mohammed Kindle, CRNA Pre-anesthesia Checklist: Patient identified, Emergency Drugs available, Suction available and Patient being monitored Patient Re-evaluated:Patient Re-evaluated prior to induction Oxygen Delivery Method: Circle system utilized Preoxygenation: Pre-oxygenation with 100% oxygen Induction Type: IV induction Ventilation: Mask ventilation without difficulty Laryngoscope Size: McGraph and 3 Grade View: Grade I Tube type: Oral Tube size: 7.0 mm Number of attempts: 1 Airway Equipment and Method: Stylet Placement Confirmation: ETT inserted through vocal cords under direct vision,  positive ETCO2,  breath sounds checked- equal and bilateral and CO2 detector Secured at: 22 cm Tube secured with: Tape Dental Injury: Teeth and Oropharynx as per pre-operative assessment

## 2019-03-11 NOTE — Interval H&P Note (Signed)
History and Physical Interval Note:  03/11/2019 12:20 PM  Becky Blake  has presented today for surgery, with the diagnosis of symptomatic cholelithiasis.  The various methods of treatment have been discussed with the patient and family. After consideration of risks, benefits and other options for treatment, the patient has consented to  Procedure(s) with comments: LAPAROSCOPIC CHOLECYSTECTOMY (N/A) - morning case, to follow Dr. Daphine Deutscher as a surgical intervention.  The patient's history has been reviewed, patient examined, no change in status, stable for surgery.  I have reviewed the patient's chart and labs.  Questions were answered to the patient's satisfaction.     [name]

## 2019-03-12 ENCOUNTER — Telehealth: Payer: Self-pay | Admitting: *Deleted

## 2019-03-12 ENCOUNTER — Telehealth: Payer: Self-pay | Admitting: Surgery

## 2019-03-12 LAB — SURGICAL PATHOLOGY

## 2019-03-12 NOTE — Telephone Encounter (Signed)
Patients calling again asking for one of the nurses to give her a call back when you get a chance. Please call and advise.

## 2019-03-12 NOTE — Telephone Encounter (Signed)
Letter for work on 03/10/2019

## 2019-03-12 NOTE — Telephone Encounter (Signed)
Patient called and stated that she needs a return back to work note for her work.

## 2019-03-12 NOTE — Telephone Encounter (Signed)
Send work note.

## 2019-03-13 NOTE — Anesthesia Postprocedure Evaluation (Signed)
Anesthesia Post Note  Patient: Becky Blake  Procedure(s) Performed: LAPAROSCOPIC CHOLECYSTECTOMY (N/A )  Patient location during evaluation: PACU Anesthesia Type: General Level of consciousness: awake and alert Pain management: pain level controlled Vital Signs Assessment: post-procedure vital signs reviewed and stable Respiratory status: spontaneous breathing, nonlabored ventilation and respiratory function stable Cardiovascular status: blood pressure returned to baseline and stable Postop Assessment: no apparent nausea or vomiting Anesthetic complications: no     Last Vitals:  Vitals:   03/11/19 1330 03/11/19 1350  BP: (!) 99/48 126/67  Pulse: 69 64  Resp: 16   Temp:    SpO2: 100% 100%    Last Pain:  Vitals:   03/12/19 0834  TempSrc:   PainSc: 2                  Jovita Gamma

## 2019-03-19 ENCOUNTER — Ambulatory Visit: Payer: Self-pay | Admitting: Surgery

## 2019-03-22 ENCOUNTER — Telehealth: Payer: Self-pay

## 2019-03-22 NOTE — Telephone Encounter (Signed)
FMLA completed and faxed to [employer] at [phone]. Placed in scan folder.

## 2019-03-24 ENCOUNTER — Other Ambulatory Visit: Payer: Self-pay

## 2019-03-24 ENCOUNTER — Ambulatory Visit (INDEPENDENT_AMBULATORY_CARE_PROVIDER_SITE_OTHER): Payer: BLUE CROSS/BLUE SHIELD | Admitting: Surgery

## 2019-03-24 ENCOUNTER — Encounter: Payer: Self-pay | Admitting: Surgery

## 2019-03-24 DIAGNOSIS — K8020 Calculus of gallbladder without cholecystitis without obstruction: Secondary | ICD-10-CM

## 2019-03-24 DIAGNOSIS — Z09 Encounter for follow-up examination after completed treatment for conditions other than malignant neoplasm: Secondary | ICD-10-CM

## 2019-03-24 NOTE — Progress Notes (Signed)
Virtual Visit via Telephone Note  I connected with Becky Blake on 03/24/19 at  9:30 AM EDT by telephone and verified that I am speaking with the correct person using two identifiers.   I discussed the limitations, risks, security and privacy concerns of performing an evaluation and management service by telephone and the availability of in person appointments. I also discussed with the patient that there may be a patient responsible charge related to this service. The patient expressed understanding and agreed to proceed.  This service was provided via telemedicine.  The patient consented to the visit being carried via telemedicine.  Patient's location:  Home  Provider's location:  Office  Referring Provider:  None  People participating in this telemedicine visit:  Patient and myself  Time spent:  15 minutes   History of Present Illness: 23 yo female s/p laparoscopic cholecystectomy for symptomatic cholelithiasis on 03/11/19.  Telephone visit today for follow up.  Patient reports doing well, with no significant pain.  She did have some mild serosanguinous drainage from umbilical incision but no signs of infection.  Tolerating po diet without any nausea, vomiting, constipation, or diarrhea.   Observations/Objective: No acute distress, able to carry conversation without any shortness of breath.  Reports incisions are clean, dry, intact, without any erythema or induration.  Assessment and Plan: 23 yo female s/p laparoscopic cholecystectomy  --Pathology discussed with patient -- chronic cholecystitis with cholelithiasis.  Negative for malignancy. --Patient still has two more weeks of no heavy lifting or pushing.  She has work note for 6 weeks total given that she would have to do very heavy lifting at work.  --Follow up prn  Follow Up Instructions:    I discussed the assessment and treatment plan with the patient. The patient was provided an opportunity to ask questions and all were  answered. The patient agreed with the plan and demonstrated an understanding of the instructions.   The patient was advised to call back or seek an in-person evaluation if the symptoms worsen or if the condition fails to improve as anticipated.  I provided 15 minutes of non-face-to-face time during this encounter.   Henrene Dodge, MD

## 2019-04-19 ENCOUNTER — Encounter: Payer: Self-pay | Admitting: *Deleted

## 2019-04-19 ENCOUNTER — Telehealth: Payer: Self-pay | Admitting: *Deleted

## 2019-04-19 NOTE — Telephone Encounter (Signed)
Patient called and had surgery on 03/11/19 and is suppose to return back to work on 04/22/19, she wants to know can she go back tomorrow?

## 2019-04-19 NOTE — Telephone Encounter (Signed)
May return to work on 04/19/2019. Patient states she is not having any problems.

## 2019-08-26 LAB — OB RESULTS CONSOLE HIV ANTIBODY (ROUTINE TESTING): HIV: NONREACTIVE

## 2019-08-26 LAB — OB RESULTS CONSOLE HEPATITIS B SURFACE ANTIGEN: Hepatitis B Surface Ag: NEGATIVE

## 2019-08-26 LAB — OB RESULTS CONSOLE RUBELLA ANTIBODY, IGM: Rubella: IMMUNE

## 2019-12-31 NOTE — L&D Delivery Note (Signed)
Patient was C/C/0 and pushed for 13 minutes with epidural.    NSVD female infant, Apgars pending NICU assignment but vigorous at delivery, weight pending.   The patient had no laceration requiring repair. Fundus was firm. EBL was expected amount. Placenta was delivered intact. Vagina was clear.  Delayed cord clamping done for 30-60 seconds while warming baby. Baby was vigorous and doing skin to skin with mother.  Philip Aspen

## 2020-01-04 IMAGING — CT CT ABD-PELV W/ CM
2 of 4 series · 17 of 46 positions shown, 19 images · IV contrast (omnipaque)
Comparison: None.

CLINICAL DATA: Abdominal pain with appendicitis suspected

EXAM:
CT ABDOMEN AND PELVIS WITH CONTRAST
TECHNIQUE: Multidetector CT imaging of the abdomen and pelvis was performed
using the standard protocol following bolus administration of
intravenous contrast.
CONTRAST:  100mL OMNIPAQUE IOHEXOL 300 MG/ML  SOLN

[Series 2: routine abd/pel with · axial · 0.89mm/px · z∈[-937,-477]mm · 14 of 102 slices shown, 16 images]
[im 5/102  soft-tissue]
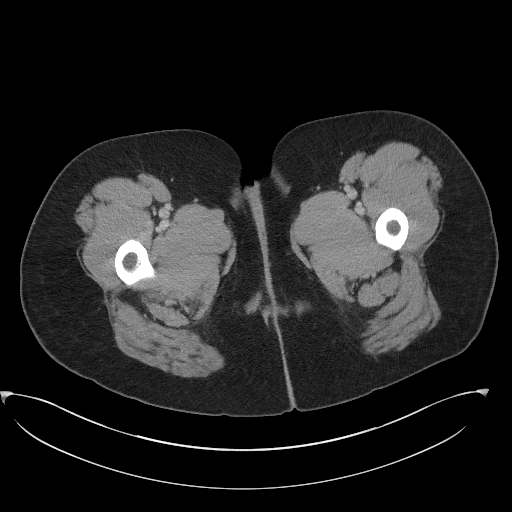
[im 5/102  bone]
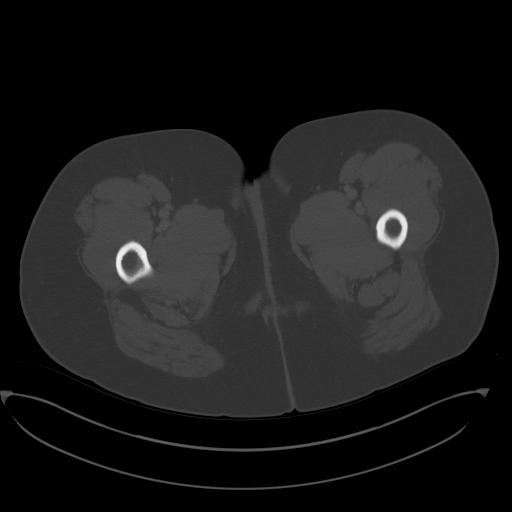
[im 13/102  soft-tissue]
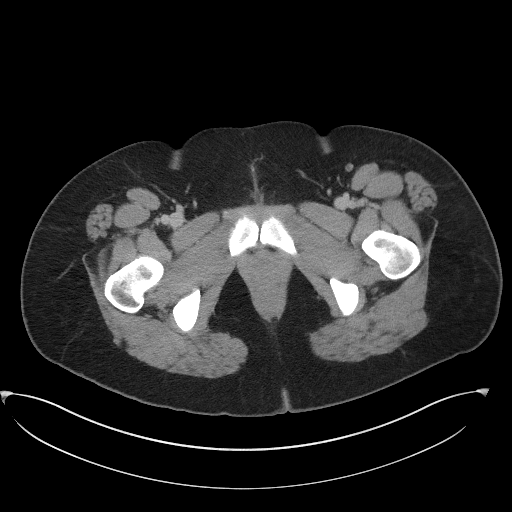
[im 22/102  soft-tissue]
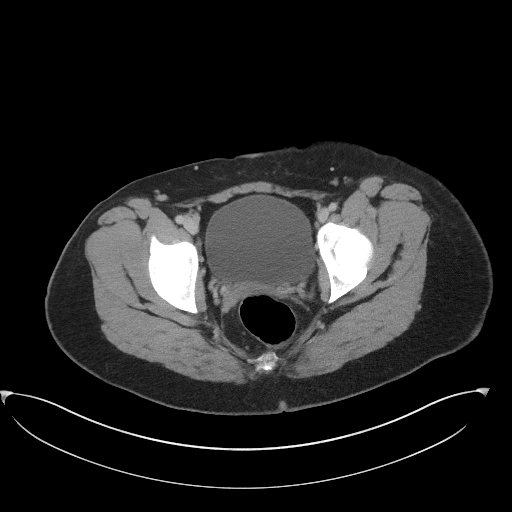
[im 26/102  soft-tissue]
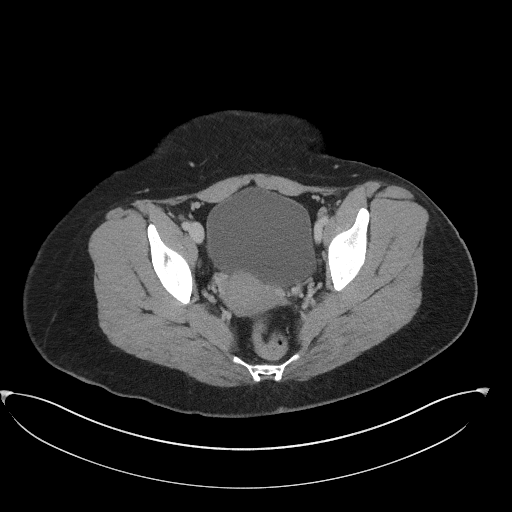
[im 34/102  soft-tissue]
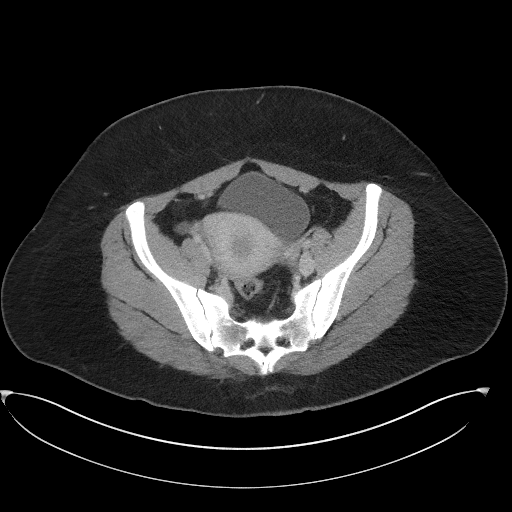
[im 43/102  soft-tissue]
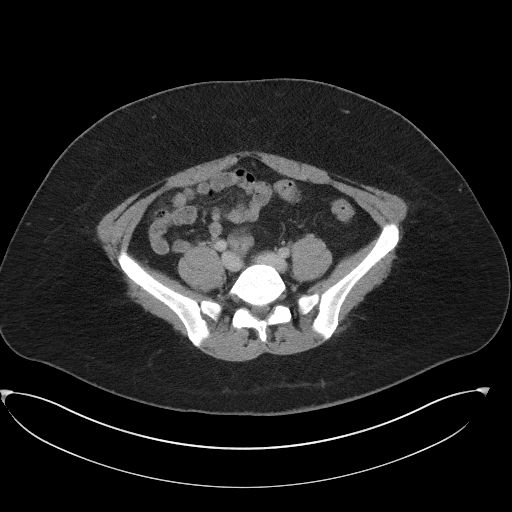
[im 47/102  soft-tissue]
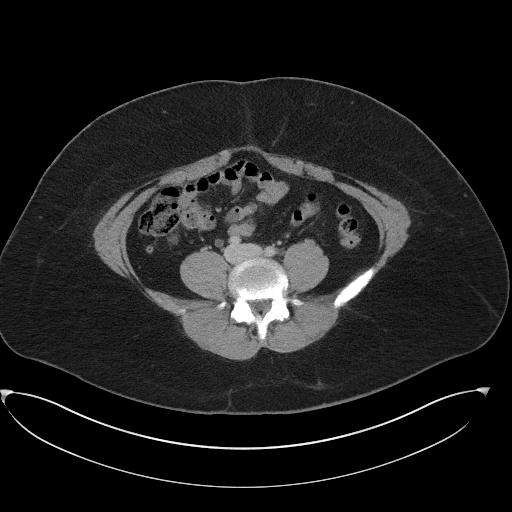
[im 55/102  soft-tissue]
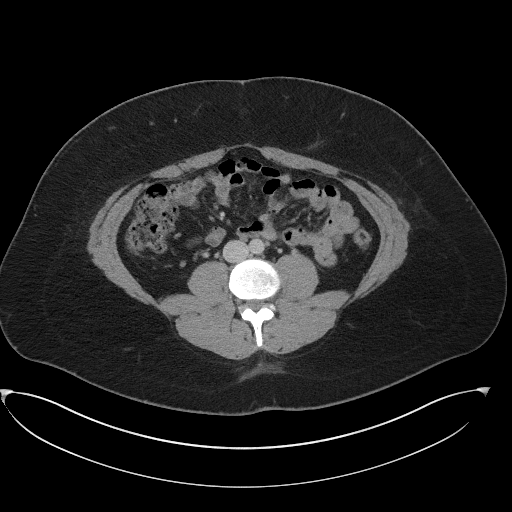
[im 59/102  soft-tissue]
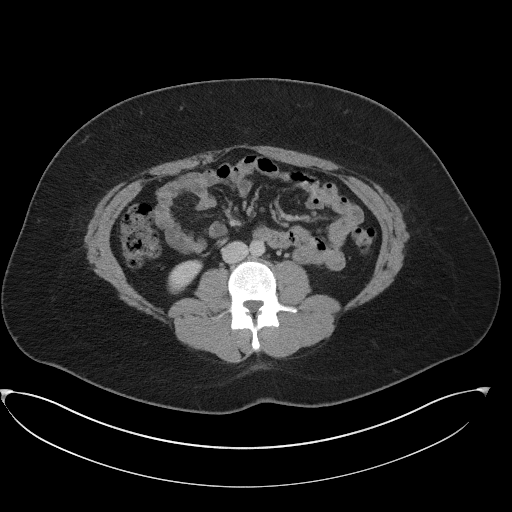
[im 59/102  bone]
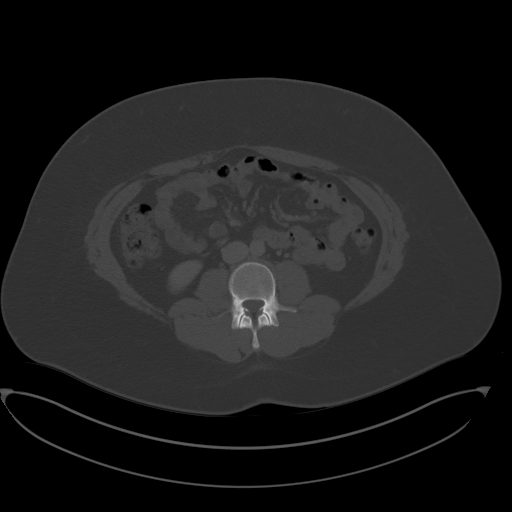
[im 68/102  soft-tissue]
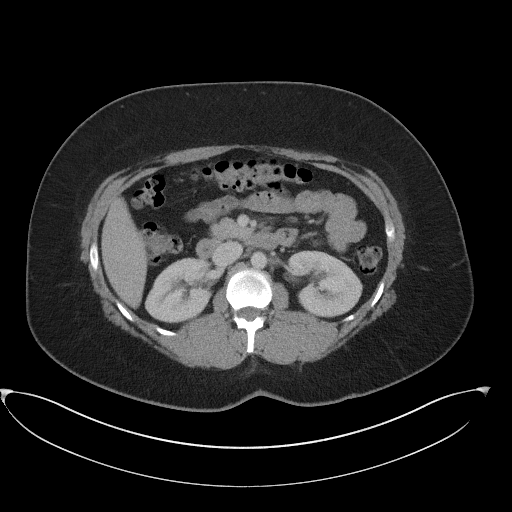
[im 76/102  soft-tissue]
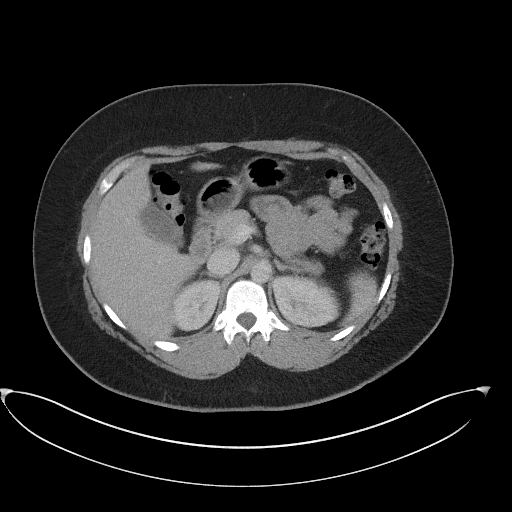
[im 80/102  soft-tissue]
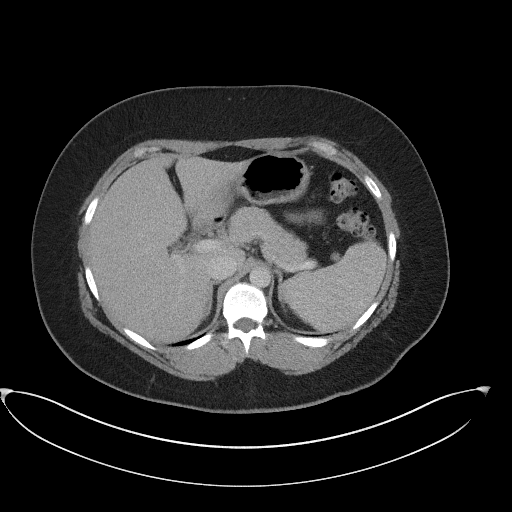
[im 89/102  soft-tissue]
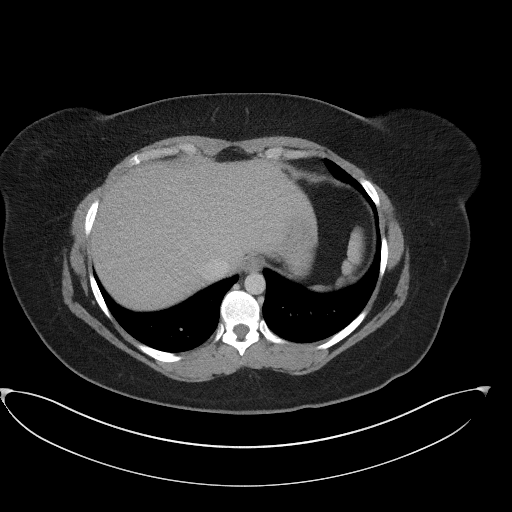
[im 97/102  soft-tissue]
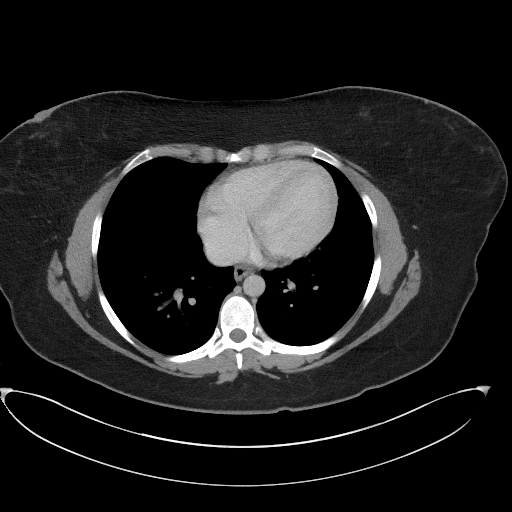

[Series 5: coronal st · coronal · 0.94mm/px · 3 of 103 slices shown]
[im 35/103  soft-tissue]
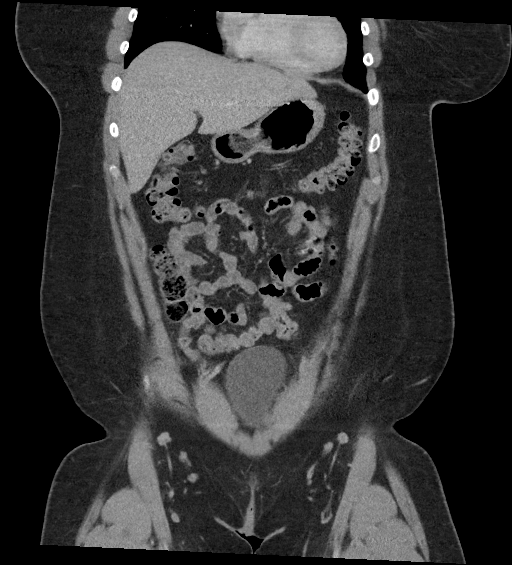
[im 46/103  soft-tissue]
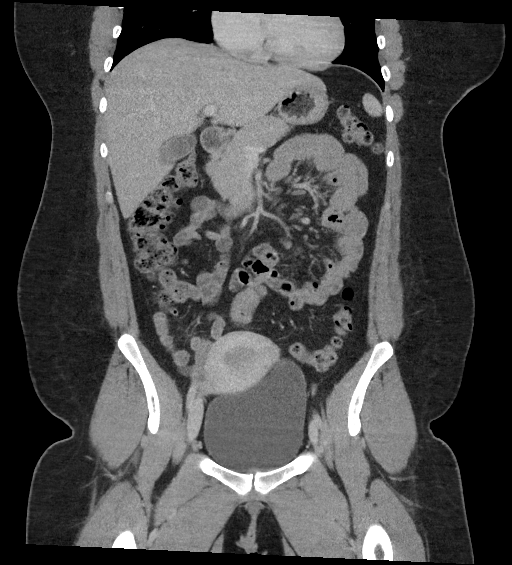
[im 57/103  soft-tissue]
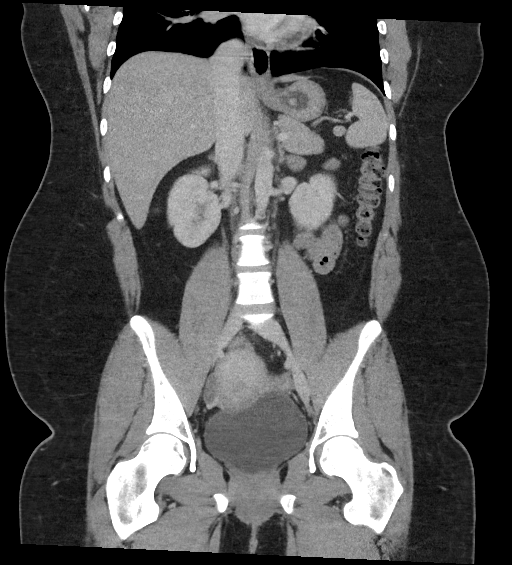

[17 of 46 positions shown; findings below may reference images not displayed]

FINDINGS: Lower chest:  No contributory findings.

Hepatobiliary: No focal liver abnormality.Cholelithiasis without
evidence of cholecystitis.

Pancreas: Unremarkable.

Spleen: Unremarkable.

Adrenals/Urinary Tract: Negative adrenals. No hydronephrosis or
stone. Unremarkable bladder.

Stomach/Bowel:  No obstruction. No appendicitis.

Vascular/Lymphatic: No acute vascular abnormality. No mass or
adenopathy.

Reproductive:No pathologic findings.

Other: No ascites or pneumoperitoneum.

Musculoskeletal: No acute abnormalities.
IMPRESSION: 1. No acute finding, including appendicitis.
2. Cholelithiasis.

## 2020-02-07 ENCOUNTER — Encounter (HOSPITAL_COMMUNITY): Payer: Self-pay | Admitting: Obstetrics and Gynecology

## 2020-02-07 ENCOUNTER — Other Ambulatory Visit: Payer: Self-pay | Admitting: Student

## 2020-02-07 ENCOUNTER — Other Ambulatory Visit: Payer: Self-pay

## 2020-02-07 ENCOUNTER — Inpatient Hospital Stay (HOSPITAL_COMMUNITY)
Admission: AD | Admit: 2020-02-07 | Discharge: 2020-02-07 | Disposition: A | Discharge status: Home / Self Care | Payer: BC Managed Care – PPO | Attending: Obstetrics and Gynecology | Admitting: Obstetrics and Gynecology

## 2020-02-07 DIAGNOSIS — A599 Trichomoniasis, unspecified: Secondary | ICD-10-CM | POA: Diagnosis not present

## 2020-02-07 DIAGNOSIS — O23593 Infection of other part of genital tract in pregnancy, third trimester: Secondary | ICD-10-CM | POA: Insufficient documentation

## 2020-02-07 DIAGNOSIS — N949 Unspecified condition associated with female genital organs and menstrual cycle: Secondary | ICD-10-CM

## 2020-02-07 DIAGNOSIS — O99343 Other mental disorders complicating pregnancy, third trimester: Secondary | ICD-10-CM | POA: Insufficient documentation

## 2020-02-07 DIAGNOSIS — R102 Pelvic and perineal pain: Secondary | ICD-10-CM | POA: Insufficient documentation

## 2020-02-07 DIAGNOSIS — F319 Bipolar disorder, unspecified: Secondary | ICD-10-CM | POA: Insufficient documentation

## 2020-02-07 DIAGNOSIS — O26893 Other specified pregnancy related conditions, third trimester: Secondary | ICD-10-CM | POA: Diagnosis present

## 2020-02-07 DIAGNOSIS — Z7982 Long term (current) use of aspirin: Secondary | ICD-10-CM | POA: Diagnosis not present

## 2020-02-07 DIAGNOSIS — Z87891 Personal history of nicotine dependence: Secondary | ICD-10-CM | POA: Diagnosis not present

## 2020-02-07 DIAGNOSIS — Z3A33 33 weeks gestation of pregnancy: Secondary | ICD-10-CM | POA: Diagnosis not present

## 2020-02-07 LAB — URINALYSIS, ROUTINE W REFLEX MICROSCOPIC
Bilirubin Urine: NEGATIVE
Glucose, UA: NEGATIVE mg/dL
Ketones, ur: 20 mg/dL — AB
Nitrite: NEGATIVE
Protein, ur: 30 mg/dL — AB
Specific Gravity, Urine: 1.019 (ref 1.005–1.030)
WBC, UA: >50 WBC/hpf — ABNORMAL HIGH (ref 0–5)
pH: 6 (ref 5.0–8.0)

## 2020-02-07 LAB — COMPREHENSIVE METABOLIC PANEL
ALT: 39 U/L (ref 0–44)
AST: 24 U/L (ref 15–41)
Albumin: 2.5 g/dL — ABNORMAL LOW (ref 3.5–5.0)
Alkaline Phosphatase: 96 U/L (ref 38–126)
Anion gap: 9 (ref 5–15)
BUN: <5 mg/dL — ABNORMAL LOW (ref 6–20)
CO2: 21 mmol/L — ABNORMAL LOW (ref 22–32)
Calcium: 8.6 mg/dL — ABNORMAL LOW (ref 8.9–10.3)
Chloride: 106 mmol/L (ref 98–111)
Creatinine, Ser: 0.59 mg/dL (ref 0.44–1.00)
GFR calc Af Amer: >60 mL/min (ref >60)
GFR calc non Af Amer: >60 mL/min (ref >60)
Glucose, Bld: 87 mg/dL (ref 70–99)
Potassium: 3.8 mmol/L (ref 3.5–5.1)
Sodium: 136 mmol/L (ref 135–145)
Total Bilirubin: 0.6 mg/dL (ref 0.3–1.2)
Total Protein: 5.8 g/dL — ABNORMAL LOW (ref 6.5–8.1)

## 2020-02-07 LAB — CBC
HCT: 32.5 % — ABNORMAL LOW (ref 36.0–46.0)
Hemoglobin: 11 g/dL — ABNORMAL LOW (ref 12.0–15.0)
MCH: 30.5 pg (ref 26.0–34.0)
MCHC: 33.8 g/dL (ref 30.0–36.0)
MCV: 90 fL (ref 80.0–100.0)
Platelets: 140 10*3/uL — ABNORMAL LOW (ref 150–400)
RBC: 3.61 MIL/uL — ABNORMAL LOW (ref 3.87–5.11)
RDW: 12.6 % (ref 11.5–15.5)
WBC: 12.1 10*3/uL — ABNORMAL HIGH (ref 4.0–10.5)
nRBC: 0 % (ref 0.0–0.2)

## 2020-02-07 LAB — WET PREP, GENITAL
Clue Cells Wet Prep HPF POC: NONE SEEN
Sperm: NONE SEEN
Yeast Wet Prep HPF POC: NONE SEEN

## 2020-02-07 MED ORDER — METRONIDAZOLE 500 MG PO TABS
2000.0000 mg | ORAL_TABLET | Freq: Once | ORAL | Status: AC
  Administered 2020-02-07: 2000 mg via ORAL
  Filled 2020-02-07: qty 4

## 2020-02-07 MED ORDER — BETAMETHASONE SOD PHOS & ACET 6 (3-3) MG/ML IJ SUSP
12.0000 mg | Freq: Once | INTRAMUSCULAR | Status: AC
  Administered 2020-02-07: 12 mg via INTRAMUSCULAR
  Filled 2020-02-07: qty 2

## 2020-02-07 MED ORDER — CYCLOBENZAPRINE HCL 10 MG PO TABS
10.0000 mg | ORAL_TABLET | Freq: Once | ORAL | Status: AC
  Administered 2020-02-07: 10 mg via ORAL
  Filled 2020-02-07: qty 1

## 2020-02-07 NOTE — MAU Note (Signed)
Pt reports she started having sharp   pain in her right mid quadrant lastr night. Pain is constant and sharp. Cannot lay down on that side. Good fetal movement reported and denies any bag bleeding or discharge. Pt stated that it felt like she had a weaker urine stream at times like "something was blocking it.

## 2020-02-07 NOTE — Discharge Instructions (Signed)
-  plan for visit at [facility] on Wednesday (two days from now). They will call you with appointment time.  -[facility] Tuesday after work for 2nd dose of betamethasone (steriods to help baby's lungs develop).     Preterm Labor and Birth Information Pregnancy normally lasts 39-41 weeks. Preterm labor is when labor starts early. It starts before you have been pregnant for 37 whole weeks. What are the risk factors for preterm labor? Preterm labor is more likely to occur in women who:  Have an infection while pregnant.  Have a cervix that is short.  Have gone into preterm labor before.  Have had surgery on their cervix.  Are younger than age 25.  Are older than age 25.  Are African American.  Are pregnant with two or more babies.  Take street drugs while pregnant.  Smoke while pregnant.  Do not gain enough weight while pregnant.  Got pregnant right after another pregnancy. What are the symptoms of preterm labor? Symptoms of preterm labor include:  Cramps. The cramps may feel like the cramps some women get during their period. The cramps may happen with watery poop (diarrhea).  Pain in the belly (abdomen).  Pain in the lower back.  Regular contractions or tightening. It may feel like your belly is getting tighter.  Pressure in the lower belly that seems to get stronger.  More fluid (discharge) leaking from the vagina. The fluid may be watery or bloody.  Water breaking. Why is it important to notice signs of preterm labor? Babies who are born early may not be fully developed. They have a higher chance for:  Long-term heart problems.  Long-term lung problems.  Trouble controlling body systems, like breathing.  Bleeding in the brain.  A condition called cerebral palsy.  Learning difficulties.  Death. These risks are highest for babies who are born before 34 weeks of pregnancy. How is preterm labor treated? Treatment depends on:  How  long you were pregnant.  Your condition.  The health of your baby. Treatment may involve:  Having a stitch (suture) placed in your cervix. When you give birth, your cervix opens so the baby can come out. The stitch keeps the cervix from opening too soon.  Staying at the hospital.  Taking or getting medicines, such as: ? Hormone medicines. ? Medicines to stop contractions. ? Medicines to help the baby's lungs develop. ? Medicines to prevent your baby from having cerebral palsy. What should I do if I am in preterm labor? If you think you are going into labor too soon, call your doctor right away. How can I prevent preterm labor?  Do not use any tobacco products. ? Examples of these are cigarettes, chewing tobacco, and e-cigarettes. ? If you need help quitting, ask your doctor.  Do not use street drugs.  Do not use any medicines unless you ask your doctor if they are safe for you.  Talk with your doctor before taking any herbal supplements.  Make sure you gain enough weight.  Watch for infection. If you think you might have an infection, get it checked right away.  If you have gone into preterm labor before, tell your doctor. This information is not intended to replace advice given to you by your health care provider. Make sure you discuss any questions you have with your health care provider. Document Revised: 04/09/2019 Document Reviewed: 05/08/2016 Elsevier Patient Education  2020 [employer].
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
## 2020-02-08 ENCOUNTER — Inpatient Hospital Stay (HOSPITAL_COMMUNITY)
Admission: AD | Admit: 2020-02-08 | Discharge: 2020-02-08 | Disposition: A | Discharge status: Home / Self Care | Payer: BC Managed Care – PPO | Attending: Obstetrics and Gynecology | Admitting: Obstetrics and Gynecology

## 2020-02-08 ENCOUNTER — Other Ambulatory Visit: Payer: Self-pay

## 2020-02-08 DIAGNOSIS — Z3A00 Weeks of gestation of pregnancy not specified: Secondary | ICD-10-CM | POA: Insufficient documentation

## 2020-02-08 LAB — GC/CHLAMYDIA PROBE AMP (~~LOC~~) NOT AT ARMC
Chlamydia: NEGATIVE
Comment: NEGATIVE
Comment: NORMAL
Neisseria Gonorrhea: NEGATIVE

## 2020-02-08 LAB — CULTURE, OB URINE

## 2020-02-08 MED ORDER — BETAMETHASONE SOD PHOS & ACET 6 (3-3) MG/ML IJ SUSP
12.0000 mg | Freq: Once | INTRAMUSCULAR | Status: AC
  Administered 2020-02-08: 18:00:00 12 mg via INTRAMUSCULAR
  Filled 2020-02-08: qty 5

## 2020-02-08 NOTE — MAU Note (Signed)
Feeling much better today.  Did ok with shot yesterday. No complaints offered.

## 2020-02-13 ENCOUNTER — Other Ambulatory Visit: Payer: Self-pay

## 2020-02-13 ENCOUNTER — Inpatient Hospital Stay (HOSPITAL_COMMUNITY)
Admission: AD | Admit: 2020-02-13 | Discharge: 2020-02-16 | DRG: 807 | Disposition: A | Discharge status: Home / Self Care | Payer: BC Managed Care – PPO | Attending: Obstetrics and Gynecology | Admitting: Obstetrics and Gynecology

## 2020-02-13 ENCOUNTER — Encounter (HOSPITAL_COMMUNITY): Payer: Self-pay | Admitting: Obstetrics and Gynecology

## 2020-02-13 DIAGNOSIS — Z20822 Contact with and (suspected) exposure to covid-19: Secondary | ICD-10-CM | POA: Diagnosis present

## 2020-02-13 DIAGNOSIS — Z3689 Encounter for other specified antenatal screening: Secondary | ICD-10-CM

## 2020-02-13 DIAGNOSIS — Z3A34 34 weeks gestation of pregnancy: Secondary | ICD-10-CM | POA: Diagnosis not present

## 2020-02-13 DIAGNOSIS — Z87891 Personal history of nicotine dependence: Secondary | ICD-10-CM | POA: Diagnosis not present

## 2020-02-13 HISTORY — DX: Other specified postprocedural states: R11.2

## 2020-02-13 HISTORY — DX: Other specified postprocedural states: Z98.890

## 2020-02-13 LAB — URINALYSIS, ROUTINE W REFLEX MICROSCOPIC
Bilirubin Urine: NEGATIVE
Glucose, UA: NEGATIVE mg/dL
Hgb urine dipstick: NEGATIVE
Ketones, ur: NEGATIVE mg/dL
Nitrite: NEGATIVE
Protein, ur: NEGATIVE mg/dL
Specific Gravity, Urine: 1.005 (ref 1.005–1.030)
pH: 7 (ref 5.0–8.0)

## 2020-02-13 LAB — CBC
HCT: 34.3 % — ABNORMAL LOW (ref 36.0–46.0)
Hemoglobin: 11.6 g/dL — ABNORMAL LOW (ref 12.0–15.0)
MCH: 30.4 pg (ref 26.0–34.0)
MCHC: 33.8 g/dL (ref 30.0–36.0)
MCV: 90 fL (ref 80.0–100.0)
Platelets: 156 10*3/uL (ref 150–400)
RBC: 3.81 MIL/uL — ABNORMAL LOW (ref 3.87–5.11)
RDW: 12.7 % (ref 11.5–15.5)
WBC: 13.3 10*3/uL — ABNORMAL HIGH (ref 4.0–10.5)
nRBC: 0 % (ref 0.0–0.2)

## 2020-02-13 LAB — TYPE AND SCREEN
ABO/RH(D): A POS
Antibody Screen: NEGATIVE

## 2020-02-13 LAB — OB RESULTS CONSOLE GBS: GBS: NEGATIVE

## 2020-02-13 LAB — ABO/RH: ABO/RH(D): A POS

## 2020-02-13 LAB — GROUP B STREP BY PCR: Group B strep by PCR: NEGATIVE

## 2020-02-13 MED ORDER — LACTATED RINGERS IV BOLUS
1000.0000 mL | Freq: Once | INTRAVENOUS | Status: AC
  Administered 2020-02-13: 1000 mL via INTRAVENOUS

## 2020-02-13 MED ORDER — ACETAMINOPHEN 325 MG PO TABS: 650.0000 mg | ORAL_TABLET | ORAL | Status: DC | PRN

## 2020-02-13 MED ORDER — OXYTOCIN 40 UNITS IN NORMAL SALINE INFUSION - SIMPLE MED
2.5000 [IU]/h | INTRAVENOUS | Status: DC
  Filled 2020-02-13: qty 1000

## 2020-02-13 MED ORDER — LACTATED RINGERS IV SOLN: 500.0000 mL | INTRAVENOUS | Status: DC | PRN

## 2020-02-13 MED ORDER — OXYCODONE-ACETAMINOPHEN 5-325 MG PO TABS: 2.0000 | ORAL_TABLET | ORAL | Status: DC | PRN

## 2020-02-13 MED ORDER — SODIUM CHLORIDE 0.9 % IV SOLN
5.0000 10*6.[IU] | Freq: Once | INTRAVENOUS | Status: AC
  Administered 2020-02-13: 5 10*6.[IU] via INTRAVENOUS
  Filled 2020-02-13: qty 5

## 2020-02-13 MED ORDER — SOD CITRATE-CITRIC ACID 500-334 MG/5ML PO SOLN: 30.0000 mL | ORAL | Status: DC | PRN

## 2020-02-13 MED ORDER — OXYTOCIN BOLUS FROM INFUSION
500.0000 mL | Freq: Once | INTRAVENOUS | Status: AC
  Administered 2020-02-14: 500 mL via INTRAVENOUS

## 2020-02-13 MED ORDER — FENTANYL CITRATE (PF) 100 MCG/2ML IJ SOLN: 50.0000 ug | INTRAMUSCULAR | Status: DC | PRN

## 2020-02-13 MED ORDER — LIDOCAINE HCL (PF) 1 % IJ SOLN: 30.0000 mL | INTRAMUSCULAR | Status: DC | PRN

## 2020-02-13 MED ORDER — PENICILLIN G POT IN DEXTROSE 60000 UNIT/ML IV SOLN: 3.0000 10*6.[IU] | INTRAVENOUS | Status: DC

## 2020-02-13 MED ORDER — LACTATED RINGERS IV SOLN: INTRAVENOUS | Status: DC

## 2020-02-13 MED ORDER — OXYCODONE-ACETAMINOPHEN 5-325 MG PO TABS: 1.0000 | ORAL_TABLET | ORAL | Status: DC | PRN

## 2020-02-13 MED ORDER — ONDANSETRON HCL 4 MG/2ML IJ SOLN: 4.0000 mg | Freq: Four times a day (QID) | INTRAMUSCULAR | Status: DC | PRN
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
## 2020-02-13 NOTE — MAU Note (Signed)
Patient states she started having vaginal pressure around 1100 today. States she feels like she feels like she needs to have a bowel movement. Started having contractions around 1400 rating them a 7/10. Reports small amount of fluid leaking since 1100. Denies VB. Reports good fetal movement.
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
## 2020-02-14 ENCOUNTER — Encounter (HOSPITAL_COMMUNITY): Payer: Self-pay | Admitting: Obstetrics and Gynecology

## 2020-02-14 LAB — RPR: RPR Ser Ql: NONREACTIVE

## 2020-02-14 LAB — SARS CORONAVIRUS 2 (TAT 6-24 HRS): SARS Coronavirus 2: NEGATIVE

## 2020-02-14 MED ORDER — ONDANSETRON HCL 4 MG PO TABS: 4.0000 mg | ORAL_TABLET | ORAL | Status: DC | PRN

## 2020-02-14 MED ORDER — OXYCODONE-ACETAMINOPHEN 5-325 MG PO TABS: 1.0000 | ORAL_TABLET | ORAL | Status: DC | PRN

## 2020-02-14 MED ORDER — OXYCODONE-ACETAMINOPHEN 5-325 MG PO TABS: 2.0000 | ORAL_TABLET | ORAL | Status: DC | PRN

## 2020-02-14 MED ORDER — SENNOSIDES-DOCUSATE SODIUM 8.6-50 MG PO TABS
2.0000 | ORAL_TABLET | ORAL | Status: DC
  Administered 2020-02-15 (×2): 2 via ORAL
  Filled 2020-02-14 (×2): qty 2

## 2020-02-14 MED ORDER — COCONUT OIL OIL: 1.0000 "application " | TOPICAL_OIL | Status: DC | PRN

## 2020-02-14 MED ORDER — DIPHENHYDRAMINE HCL 25 MG PO CAPS: 25.0000 mg | ORAL_CAPSULE | Freq: Four times a day (QID) | ORAL | Status: DC | PRN

## 2020-02-14 MED ORDER — OXYTOCIN 40 UNITS IN NORMAL SALINE INFUSION - SIMPLE MED
1.0000 m[IU]/min | INTRAVENOUS | Status: DC
  Administered 2020-02-14: 2 m[IU]/min via INTRAVENOUS

## 2020-02-14 MED ORDER — TETANUS-DIPHTH-ACELL PERTUSSIS 5-2.5-18.5 LF-MCG/0.5 IM SUSP: 0.5000 mL | Freq: Once | INTRAMUSCULAR | Status: DC

## 2020-02-14 MED ORDER — ACETAMINOPHEN 325 MG PO TABS: 650.0000 mg | ORAL_TABLET | ORAL | Status: DC | PRN

## 2020-02-14 MED ORDER — TERBUTALINE SULFATE 1 MG/ML IJ SOLN: 0.2500 mg | Freq: Once | INTRAMUSCULAR | Status: DC | PRN

## 2020-02-14 MED ORDER — WITCH HAZEL-GLYCERIN EX PADS: 1.0000 "application " | MEDICATED_PAD | CUTANEOUS | Status: DC | PRN

## 2020-02-14 MED ORDER — SIMETHICONE 80 MG PO CHEW
80.0000 mg | CHEWABLE_TABLET | ORAL | Status: DC | PRN
  Filled 2020-02-14: qty 1

## 2020-02-14 MED ORDER — ONDANSETRON HCL 4 MG/2ML IJ SOLN: 4.0000 mg | INTRAMUSCULAR | Status: DC | PRN

## 2020-02-14 MED ORDER — IBUPROFEN 600 MG PO TABS
600.0000 mg | ORAL_TABLET | Freq: Four times a day (QID) | ORAL | Status: DC
  Administered 2020-02-14 – 2020-02-16 (×8): 600 mg via ORAL
  Filled 2020-02-14 (×8): qty 1

## 2020-02-14 MED ORDER — ZOLPIDEM TARTRATE 5 MG PO TABS: 5.0000 mg | ORAL_TABLET | Freq: Every evening | ORAL | Status: DC | PRN

## 2020-02-14 MED ORDER — DIBUCAINE (PERIANAL) 1 % EX OINT: 1.0000 "application " | TOPICAL_OINTMENT | CUTANEOUS | Status: DC | PRN

## 2020-02-14 MED ORDER — PRENATAL MULTIVITAMIN CH
1.0000 | ORAL_TABLET | Freq: Every day | ORAL | Status: DC
  Administered 2020-02-15 – 2020-02-16 (×2): 1 via ORAL
  Filled 2020-02-14 (×2): qty 1

## 2020-02-14 MED ORDER — BENZOCAINE-MENTHOL 20-0.5 % EX AERO: 1.0000 "application " | INHALATION_SPRAY | CUTANEOUS | Status: DC | PRN

## 2020-02-14 NOTE — Progress Notes (Signed)
Pt feeling more pressure FHT: 150 mod var, +10/10, occ 15x15, recent variable decel approx 11am.   TOCO: being adjusted SVE: unchanged, attempted AROM, unable d/t station of baby's head, no longer well applied and pt discomfort with exam A/P:  Will start pitocin 2x2 and re-attempted AROM later Continue other routine care

## 2020-02-14 NOTE — Plan of Care (Signed)
Pts. Condition will continue to improve 

## 2020-02-14 NOTE — Plan of Care (Signed)
L&D care plan complete 

## 2020-02-15 LAB — CBC
HCT: 33.6 % — ABNORMAL LOW (ref 36.0–46.0)
Hemoglobin: 11 g/dL — ABNORMAL LOW (ref 12.0–15.0)
MCH: 30.2 pg (ref 26.0–34.0)
MCHC: 32.7 g/dL (ref 30.0–36.0)
MCV: 92.3 fL (ref 80.0–100.0)
Platelets: 145 10*3/uL — ABNORMAL LOW (ref 150–400)
RBC: 3.64 MIL/uL — ABNORMAL LOW (ref 3.87–5.11)
RDW: 12.7 % (ref 11.5–15.5)
WBC: 12.6 10*3/uL — ABNORMAL HIGH (ref 4.0–10.5)
nRBC: 0 % (ref 0.0–0.2)

## 2020-02-15 NOTE — Progress Notes (Signed)
Patient is eating, ambulating, voiding.  Pain control is good.  Vitals:   02/14/20 2000 02/14/20 2100 02/15/20 0015 02/15/20 0551  BP: 119/65 130/88 100/61 (!) 107/55  Pulse: 73 86 74 68  Resp: 18 18 18 18   Temp: 98.1 F (36.7 C) 98.2 F (36.8 C) 98.7 F (37.1 C) 98.5 F (36.9 C)  TempSrc:      SpO2: 99% 98% 98% 100%    Fundus firm Perineum without swelling.  Lab Results  Component Value Date   WBC 13.3 (H) 02/13/2020   HGB 11.6 (L) 02/13/2020   HCT 34.3 (L) 02/13/2020   MCV 90.0 02/13/2020   PLT 156 02/13/2020    --/--/A POS, A POS Performed at [facility], [address] [place], [place]  (02/14 1910)/[place]  A/P Post partum day 1.  Routine care.  Expect d/c tomorrow.    06-29-1977

## 2020-02-15 NOTE — Lactation Note (Signed)
This note was copied from a baby's chart. Lactation Consultation Note  Patient Name: Becky Blake [id number] Date: 02/15/2020 Reason for consult: Initial assessment;Infant < 6lbs;Late-preterm 34-36.6wks;1st time breastfeeding P2.  Mom did not breastfeed her first baby.  Baby was 74 weeks old and never latched.  Newborn is 34.4 weeks and 15 hours old.  Mom has initiated pumping with symphony pump but was unsure how often to pump.  Mom is currently skin to skin with baby.  Baby is sleeping.  Mom does not have a pump at home but has WIC with [facility] [place] [facility]. Referral faxed.  Instructed to pump every 3 hours x 15 minutes.  Mom has no questions about pump.  Discussed expectations for late preterm feeding.  Stressed importance of establishing and maintaining milk supply.  Maternal Data Does the patient have breastfeeding experience prior to this delivery?: No  Feeding Feeding Type: Donor Breast Milk  LATCH Score                   Interventions    Lactation Tools Discussed/Used WIC Program: Yes Initiated by:: RN Date initiated:: 02/15/20   Consult Status Consult Status: Follow-up Date: 02/16/20 Follow-up type: In-patient    Huston Foley 02/15/2020, 9:29 AM

## 2020-02-16 ENCOUNTER — Encounter (HOSPITAL_COMMUNITY): Payer: Self-pay | Admitting: Obstetrics and Gynecology

## 2020-02-16 NOTE — Discharge Summary (Signed)
Obstetric Discharge Summary Reason for Admission: preterm labor Prenatal Procedures: ultrasound Intrapartum Procedures: spontaneous vaginal delivery Postpartum Procedures: none Complications-Operative and Postpartum: none Hemoglobin  Date Value Ref Range Status  02/15/2020 11.0 (L) 12.0 - 15.0 g/dL Final   HCT  Date Value Ref Range Status  02/15/2020 33.6 (L) 36.0 - 46.0 % Final    Physical Exam:  General: alert and cooperative Lochia: appropriate  Discharge Diagnoses: Premature labor  Discharge Information: Date: 02/16/2020 Activity: pelvic rest Diet: routine Medications: PNV and Ibuprofen Condition: stable Instructions: refer to practice specific booklet Discharge to: home Follow-up Information    Philip Aspen, DO. Schedule an appointment as soon as possible for a visit in 1 month(s).   Specialty: Obstetrics and Gynecology Contact information: [address] [place] [address] [phone]           Newborn Data: Live born female  Birth Weight: 5 lb 11 oz (2580 g) APGAR: 7, 9  Newborn Delivery   Birth date/time: 02/14/2020 17:55:00 Delivery type: Vaginal, Spontaneous      Home with in NICU.  ANDERSON,MARK E 02/16/2020, 9:07 AM

## 2020-02-16 NOTE — Lactation Note (Signed)
This note was copied from a baby's chart. Lactation Consultation Note  Patient Name: Becky Blake [id number] Date: 02/16/2020   Lactation visit attempted, but SW was in room.   Becky Blake [facility] 02/16/2020, 2:38 PM

## 2020-02-16 NOTE — Progress Notes (Signed)
PPD#2  Pt states that she is doing well. No vaginal bleeding VSSAF IMP/ Stable Plan/Will discharge

## 2020-02-17 ENCOUNTER — Ambulatory Visit: Payer: Self-pay

## 2020-02-17 NOTE — Lactation Note (Signed)
This note was copied from a baby's chart. Lactation Consultation Note  Patient Name: Becky Blake [id number] Date: 02/17/2020 Reason for consult: Follow-up assessment;NICU baby;Late-preterm 34-36.6wks;Infant < 6lbs  LC in to visit with P2 Mom of LPTI at 68 hrs old.  Mom is trying baby at the breast when she can.  Baby has latched and taken a few sucks per Mom, but baby is sleepy on the breast.   Will try to attend the 12 noon breastfeeding. Encouraged continued STS, and offering breast with cues.  Encouraged frequent double pumping.  Last pumping Mom expressed 7 ml.  Breasts are filling.   Interventions Interventions: Breast feeding basics reviewed;Skin to skin;Breast massage;Hand express;DEBP;Expressed milk;Support pillows  Lactation Tools Discussed/Used Tools: Pump Breast pump type: Double-Electric Breast Pump   Consult Status Consult Status: Follow-up Date: 02/18/20 Follow-up type: In-patient    Judee Clara 02/17/2020, 9:55 AM

## 2020-02-21 ENCOUNTER — Ambulatory Visit: Payer: Self-pay

## 2020-02-21 NOTE — Lactation Note (Signed)
This note was copied from a baby's chart. Lactation Consultation Note  Patient Name: Girl Dezaray Shibuya [id number] Date: 02/21/2020 Reason for consult: Follow-up assessment;NICU baby;Late-preterm 43-36.6wks   Visited with mother and baby in NICU. Baby 38 days old.  Now [redacted]w[redacted]d. Per mother and RN, baby has been sleepy today. Mother states she did latch at midnight last night for a few minutes. Mother currently allowing sleepy baby to nuzzle at breast. Mother is pumping between 2.5 oz - 3.5 oz per session. Mother is rooming in and knows to pick up her [facility] pump once she leaves here. Praised mother for her efforts.  Mother knows to ask for assistance with lactation as needed. Lactation will follow up later this week.    Maternal Data    Feeding Feeding Type: Breast Milk  LATCH Score                   Interventions Interventions: Breast feeding basics reviewed;DEBP  Lactation Tools Discussed/Used     Consult Status Consult Status: Follow-up Date: 02/24/20 Follow-up type: In-patient    Dahlia Byes [facility] 02/21/2020, 2:42 PM

## 2020-03-06 ENCOUNTER — Ambulatory Visit: Payer: Self-pay

## 2020-03-06 NOTE — Lactation Note (Signed)
This note was copied from a baby's chart. Lactation Consultation Note  Patient Name: Becky Blake [id number] Date: 03/06/2020   Lactation stopped in to check on Mom while she was visiting infant, but Mom was sleeping.    Lurline Hare [facility] 03/06/2020, 4:49 PM

## 2020-12-26 ENCOUNTER — Encounter: Payer: Self-pay | Admitting: Emergency Medicine

## 2020-12-26 ENCOUNTER — Emergency Department
Admission: EM | Admit: 2020-12-26 | Discharge: 2020-12-26 | Disposition: A | Discharge status: Home / Self Care | Payer: 59 | Attending: Emergency Medicine | Admitting: Emergency Medicine

## 2020-12-26 ENCOUNTER — Other Ambulatory Visit: Payer: Self-pay

## 2020-12-26 ENCOUNTER — Emergency Department: Payer: 59

## 2020-12-26 DIAGNOSIS — F17290 Nicotine dependence, other tobacco product, uncomplicated: Secondary | ICD-10-CM | POA: Diagnosis not present

## 2020-12-26 DIAGNOSIS — R1031 Right lower quadrant pain: Secondary | ICD-10-CM | POA: Diagnosis present

## 2020-12-26 DIAGNOSIS — N390 Urinary tract infection, site not specified: Secondary | ICD-10-CM | POA: Insufficient documentation

## 2020-12-26 DIAGNOSIS — R102 Pelvic and perineal pain: Secondary | ICD-10-CM

## 2020-12-26 LAB — COMPREHENSIVE METABOLIC PANEL
ALT: 20 U/L (ref 0–44)
AST: 19 U/L (ref 15–41)
Albumin: 4.4 g/dL (ref 3.5–5.0)
Alkaline Phosphatase: 46 U/L (ref 38–126)
Anion gap: 10 (ref 5–15)
BUN: 13 mg/dL (ref 6–20)
CO2: 25 mmol/L (ref 22–32)
Calcium: 9.3 mg/dL (ref 8.9–10.3)
Chloride: 104 mmol/L (ref 98–111)
Creatinine, Ser: 0.68 mg/dL (ref 0.44–1.00)
GFR, Estimated: >60 mL/min (ref >60)
Glucose, Bld: 106 mg/dL — ABNORMAL HIGH (ref 70–99)
Potassium: 4.6 mmol/L (ref 3.5–5.1)
Sodium: 139 mmol/L (ref 135–145)
Total Bilirubin: 0.7 mg/dL (ref 0.3–1.2)
Total Protein: 7.5 g/dL (ref 6.5–8.1)

## 2020-12-26 LAB — URINALYSIS, COMPLETE (UACMP) WITH MICROSCOPIC
Bilirubin Urine: NEGATIVE
Glucose, UA: NEGATIVE mg/dL
Ketones, ur: NEGATIVE mg/dL
Nitrite: NEGATIVE
Protein, ur: NEGATIVE mg/dL
Specific Gravity, Urine: 1.011 (ref 1.005–1.030)
pH: 7 (ref 5.0–8.0)

## 2020-12-26 LAB — CBC
HCT: 43 % (ref 36.0–46.0)
Hemoglobin: 14.3 g/dL (ref 12.0–15.0)
MCH: 29.5 pg (ref 26.0–34.0)
MCHC: 33.3 g/dL (ref 30.0–36.0)
MCV: 88.7 fL (ref 80.0–100.0)
Platelets: 177 10*3/uL (ref 150–400)
RBC: 4.85 MIL/uL (ref 3.87–5.11)
RDW: 13 % (ref 11.5–15.5)
WBC: 6.6 10*3/uL (ref 4.0–10.5)
nRBC: 0 % (ref 0.0–0.2)

## 2020-12-26 LAB — POC URINE PREG, ED: Preg Test, Ur: NEGATIVE

## 2020-12-26 LAB — LIPASE, BLOOD: Lipase: 26 U/L (ref 11–51)

## 2020-12-26 MED ORDER — IOHEXOL 300 MG/ML  SOLN
100.0000 mL | Freq: Once | INTRAMUSCULAR | Status: AC | PRN
  Administered 2020-12-26: 100 mL via INTRAVENOUS
  Filled 2020-12-26: qty 100

## 2020-12-26 MED ORDER — SULFAMETHOXAZOLE-TRIMETHOPRIM 800-160 MG PO TABS: 1.0000 | ORAL_TABLET | Freq: Two times a day (BID) | ORAL | 0 refills | Status: AC

## 2020-12-26 NOTE — Discharge Instructions (Signed)
Follow-up with your regular doctor to have your IUD removed Return the emergency department if your symptoms are worsening Take antibiotic as prescribed
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
## 2020-12-26 NOTE — ED Triage Notes (Signed)
RLQ abdominal pain this morning.  States stomach has been bothering her lately.  AAOx3.  Skin warm and dry. NAD

## 2020-12-28 LAB — URINE CULTURE

## 2020-12-29 ENCOUNTER — Emergency Department: Payer: 59

## 2020-12-29 ENCOUNTER — Other Ambulatory Visit: Payer: Self-pay

## 2020-12-29 ENCOUNTER — Emergency Department
Admission: EM | Admit: 2020-12-29 | Discharge: 2020-12-29 | Disposition: A | Discharge status: Home / Self Care | Payer: 59 | Attending: Emergency Medicine | Admitting: Emergency Medicine

## 2020-12-29 DIAGNOSIS — N939 Abnormal uterine and vaginal bleeding, unspecified: Secondary | ICD-10-CM | POA: Insufficient documentation

## 2020-12-29 DIAGNOSIS — T8339XA Other mechanical complication of intrauterine contraceptive device, initial encounter: Secondary | ICD-10-CM | POA: Diagnosis not present

## 2020-12-29 DIAGNOSIS — E119 Type 2 diabetes mellitus without complications: Secondary | ICD-10-CM | POA: Insufficient documentation

## 2020-12-29 DIAGNOSIS — Z87891 Personal history of nicotine dependence: Secondary | ICD-10-CM | POA: Diagnosis not present

## 2020-12-29 DIAGNOSIS — T839XXA Unspecified complication of genitourinary prosthetic device, implant and graft, initial encounter: Secondary | ICD-10-CM

## 2020-12-29 LAB — URINALYSIS, ROUTINE W REFLEX MICROSCOPIC
Bilirubin Urine: NEGATIVE
Glucose, UA: NEGATIVE mg/dL
Ketones, ur: NEGATIVE mg/dL
Nitrite: NEGATIVE
Protein, ur: NEGATIVE mg/dL
Specific Gravity, Urine: 1.005 (ref 1.005–1.030)
pH: 5 (ref 5.0–8.0)

## 2020-12-29 LAB — POC URINE PREG, ED: Preg Test, Ur: NEGATIVE

## 2020-12-29 NOTE — ED Notes (Signed)
Pt discussed with PA Christiane Ha

## 2020-12-29 NOTE — ED Triage Notes (Signed)
Pt here via POV from home with complaints of uterine cramping and vaginal bleeding.   Pt states she was told her IUD was malpositioned to the left and she has an appointment to have it removed on Friday 1/7. Today pt reports light vaginal bleeding, bright red when wiping, accompanied by lower abdominal cramping. Pt states she usually gets a period with her IUD but it is extremely light and only has it for three days. Denies all other symptoms.
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
## 2021-04-04 ENCOUNTER — Other Ambulatory Visit: Payer: Self-pay

## 2021-04-04 DIAGNOSIS — W57XXXA Bitten or stung by nonvenomous insect and other nonvenomous arthropods, initial encounter: Secondary | ICD-10-CM | POA: Diagnosis not present

## 2021-04-04 DIAGNOSIS — S90862A Insect bite (nonvenomous), left foot, initial encounter: Secondary | ICD-10-CM | POA: Insufficient documentation

## 2021-04-04 DIAGNOSIS — Z5321 Procedure and treatment not carried out due to patient leaving prior to being seen by health care provider: Secondary | ICD-10-CM | POA: Diagnosis not present

## 2021-04-04 NOTE — ED Triage Notes (Signed)
Pt to ED from home c/o spider bite to left foot tonight around 2330 while lying in bed. Pt pinky toe red, no obvious puncture wound to foot.

## 2021-04-05 ENCOUNTER — Encounter: Payer: Self-pay | Admitting: Emergency Medicine

## 2021-04-05 ENCOUNTER — Other Ambulatory Visit: Payer: Self-pay

## 2021-04-05 ENCOUNTER — Emergency Department
Admission: EM | Admit: 2021-04-05 | Discharge: 2021-04-05 | Disposition: A | Discharge status: Home / Self Care | Payer: 59 | Attending: Emergency Medicine | Admitting: Emergency Medicine

## 2021-04-05 NOTE — ED Notes (Signed)
No answer when called several times from lobby; no answer when phone # listed in chart called 

## 2022-07-05 ENCOUNTER — Encounter: Payer: Self-pay | Admitting: Emergency Medicine

## 2022-07-05 ENCOUNTER — Emergency Department
Admission: EM | Admit: 2022-07-05 | Discharge: 2022-07-05 | Discharge status: Against Medical Advice | Payer: Medicaid Other | Attending: Emergency Medicine | Admitting: Emergency Medicine

## 2022-07-05 ENCOUNTER — Emergency Department: Payer: Medicaid Other

## 2022-07-05 DIAGNOSIS — R509 Fever, unspecified: Secondary | ICD-10-CM | POA: Insufficient documentation

## 2022-07-05 DIAGNOSIS — Z20822 Contact with and (suspected) exposure to covid-19: Secondary | ICD-10-CM | POA: Insufficient documentation

## 2022-07-05 DIAGNOSIS — J029 Acute pharyngitis, unspecified: Secondary | ICD-10-CM | POA: Insufficient documentation

## 2022-07-05 DIAGNOSIS — R059 Cough, unspecified: Secondary | ICD-10-CM | POA: Diagnosis present

## 2022-07-05 DIAGNOSIS — R0781 Pleurodynia: Secondary | ICD-10-CM | POA: Insufficient documentation

## 2022-07-05 DIAGNOSIS — Z5321 Procedure and treatment not carried out due to patient leaving prior to being seen by health care provider: Secondary | ICD-10-CM | POA: Insufficient documentation

## 2022-07-05 LAB — BASIC METABOLIC PANEL
Anion gap: 6 (ref 5–15)
BUN: <5 mg/dL — ABNORMAL LOW (ref 6–20)
CO2: 25 mmol/L (ref 22–32)
Calcium: 8.9 mg/dL (ref 8.9–10.3)
Chloride: 108 mmol/L (ref 98–111)
Creatinine, Ser: 0.72 mg/dL (ref 0.44–1.00)
GFR, Estimated: >60 mL/min (ref >60)
Glucose, Bld: 113 mg/dL — ABNORMAL HIGH (ref 70–99)
Potassium: 4.2 mmol/L (ref 3.5–5.1)
Sodium: 139 mmol/L (ref 135–145)

## 2022-07-05 LAB — SARS CORONAVIRUS 2 BY RT PCR: SARS Coronavirus 2 by RT PCR: NEGATIVE

## 2022-07-05 LAB — CBC
HCT: 42.2 % (ref 36.0–46.0)
Hemoglobin: 13.6 g/dL (ref 12.0–15.0)
MCH: 28.2 pg (ref 26.0–34.0)
MCHC: 32.2 g/dL (ref 30.0–36.0)
MCV: 87.4 fL (ref 80.0–100.0)
Platelets: 169 10*3/uL (ref 150–400)
RBC: 4.83 MIL/uL (ref 3.87–5.11)
RDW: 12.6 % (ref 11.5–15.5)
WBC: 3.3 10*3/uL — ABNORMAL LOW (ref 4.0–10.5)
nRBC: 0 % (ref 0.0–0.2)

## 2022-07-05 LAB — GROUP A STREP BY PCR: Group A Strep by PCR: NOT DETECTED

## 2022-07-05 LAB — TROPONIN I (HIGH SENSITIVITY): Troponin I (High Sensitivity): 2 ng/L (ref <18)

## 2022-07-05 NOTE — ED Provider Triage Note (Signed)
Emergency Medicine Provider Triage Evaluation Note  Becky Blake, a 26 y.o. female  was evaluated in triage.  Pt complains of cough, sore throat, subjective fevers.  Patient reports that this will last 3 days.  She reports a negative home COVID test today.  Reports chest and rib pain pain when she coughs.  She has a history of pneumonia  Review of Systems  Positive: Cough, sore throat, subjective fevers Negative: Hemoptysis vomiting  Physical Exam  BP (!) 152/96   Pulse (!) 125   Temp 99.9 F (37.7 C) (Oral)   Resp (!) 22   Ht 5\' 3"  (1.6 m)   Wt 102 kg   SpO2 97%   BMI 39.83 kg/m  Gen:   Awake, no distress NAD Resp:  Normal effort harsh breath sounds noted bilaterally MSK:   Moves extremities without difficulty  CVS:  Tachy rate   Medical Decision Making  Medically screening exam initiated at 3:49 PM.  Appropriate orders placed.  Becky Blake was informed that the remainder of the evaluation will be completed by another provider, this initial triage assessment does not replace that evaluation, and the importance of remaining in the ED until their evaluation is complete.  Patient to the ED for evaluation of 3 days of cough, sore throat, and subjective fevers.   Fredda Hammed, PA-C 07/05/22 1550

## 2022-07-05 NOTE — ED Triage Notes (Signed)
Pt endorses cough, sore throat, fever for 3 days. Pt had negative covid test today. Endorses nausea. Also having rib cage pain and CP when coughing. Hx of PNA and worried of same.

## 2022-07-05 NOTE — ED Notes (Signed)
Called pt several times no answer  

## 2022-11-25 ENCOUNTER — Other Ambulatory Visit: Payer: Self-pay

## 2022-11-25 ENCOUNTER — Encounter: Payer: Self-pay | Admitting: Emergency Medicine

## 2022-11-25 ENCOUNTER — Emergency Department: Payer: Medicaid Other

## 2022-11-25 ENCOUNTER — Emergency Department
Admission: EM | Admit: 2022-11-25 | Discharge: 2022-11-25 | Disposition: A | Discharge status: Home / Self Care | Payer: Medicaid Other | Attending: Emergency Medicine | Admitting: Emergency Medicine

## 2022-11-25 DIAGNOSIS — U071 COVID-19: Secondary | ICD-10-CM | POA: Diagnosis not present

## 2022-11-25 DIAGNOSIS — E119 Type 2 diabetes mellitus without complications: Secondary | ICD-10-CM | POA: Diagnosis not present

## 2022-11-25 DIAGNOSIS — R059 Cough, unspecified: Secondary | ICD-10-CM | POA: Diagnosis not present

## 2022-11-25 LAB — RESP PANEL BY RT-PCR (FLU A&B, COVID) ARPGX2
Influenza A by PCR: NEGATIVE
Influenza B by PCR: NEGATIVE
SARS Coronavirus 2 by RT PCR: POSITIVE — AB

## 2022-11-25 MED ORDER — ACETAMINOPHEN 500 MG PO TABS
1000.0000 mg | ORAL_TABLET | Freq: Once | ORAL | Status: AC
  Administered 2022-11-25: 1000 mg via ORAL
  Filled 2022-11-25: qty 2

## 2022-11-25 NOTE — ED Provider Notes (Signed)
   [facility] Provider Note    Event Date/Time   First MD Initiated Contact with Patient 11/25/22 [id number]     (approximate)   History   Cough   HPI  Becky Blake is a 26 y.o. female with a history of diabetes, bipolar disorder who presents with complaints of cough.  Patient reports several days of cough, has had fever as well.  Reports significant other was sick with similar symptoms last week.No shortness of breath, no chest pain     Physical Exam   Triage Vital Signs: ED Triage Vitals  Enc Vitals Group     BP 11/25/22 0634 135/79     Pulse Rate 11/25/22 0634 (!) 127     Resp 11/25/22 0634 20     Temp 11/25/22 0634 (!) 101.1 F (38.4 C)     Temp Source 11/25/22 0634 Oral     SpO2 11/25/22 0634 97 %     Weight 11/25/22 0636 121.6 kg (268 lb)     Height 11/25/22 0636 1.6 m (5\' 3" )     Head Circumference --      Peak Flow --      Pain Score 11/25/22 0636 0     Pain Loc --      Pain Edu? --      Excl. in GC? --     Most recent vital signs: Vitals:   11/25/22 0634 11/25/22 0832  BP: 135/79 130/70  Pulse: (!) 127 (!) 101  Resp: 20 20  Temp: (!) 101.1 F (38.4 C) 99 F (37.2 C)  SpO2: 97% 98%     General: Awake, no distress.  CV:  Good peripheral perfusion.  Resp:  Normal effort.  Clear to auscultation bilaterally Abd:  No distention.  Other:     ED Results / Procedures / Treatments   Labs (all labs ordered are listed, but only abnormal results are displayed) Labs Reviewed  RESP PANEL BY RT-PCR (FLU A&B, COVID) ARPGX2 - Abnormal; Notable for the following components:      Result Value   SARS Coronavirus 2 by RT PCR POSITIVE (*)    All other components within normal limits     EKG     RADIOLOGY Chest x-ray viewed interpreted by me, no pneumonia    PROCEDURES:  Critical Care performed:   Procedures   MEDICATIONS ORDERED IN ED: Medications  acetaminophen (TYLENOL) tablet 1,000 mg (1,000 mg Oral Given 11/25/22  0654)     IMPRESSION / MDM / ASSESSMENT AND PLAN / ED COURSE  I reviewed the triage vital signs and the nursing notes. Patient's presentation is most consistent with acute complicated illness / injury requiring diagnostic workup.  Patient presents with cough, fever as noted above.  Differential includes pneumonia, upper respiratory illness, bronchitis  Chest x-ray negative for pneumonia, pending COVID flu PCR  COVID test is positive, she is outside the window for Paxlovid, recommend supportive care, outpatient follow-up as needed, return precautions discussed, no indication for admission.      FINAL CLINICAL IMPRESSION(S) / ED DIAGNOSES   Final diagnoses:  COVID-19     Rx / DC Orders   ED Discharge Orders     None        Note:  This document was prepared using Dragon voice recognition software and may include unintentional dictation errors.   11/27/22, MD 11/25/22 [phone]

## 2022-11-25 NOTE — ED Triage Notes (Signed)
Pt presents via POV with complaints of nasal congestion with a cough for the last week. Pts S/O had the same sx last week. Endorses a fever and has been treated with tylenol & ibuprofen. Denies CP or SOB.

## 2023-04-13 ENCOUNTER — Encounter: Payer: Self-pay | Admitting: Emergency Medicine

## 2023-04-13 ENCOUNTER — Emergency Department: Payer: Medicaid Other

## 2023-04-13 ENCOUNTER — Other Ambulatory Visit: Payer: Self-pay

## 2023-04-13 ENCOUNTER — Emergency Department
Admission: EM | Admit: 2023-04-13 | Discharge: 2023-04-13 | Disposition: A | Discharge status: Home / Self Care | Payer: Medicaid Other | Attending: Emergency Medicine | Admitting: Emergency Medicine

## 2023-04-13 DIAGNOSIS — M25572 Pain in left ankle and joints of left foot: Secondary | ICD-10-CM | POA: Diagnosis present

## 2023-04-13 DIAGNOSIS — M722 Plantar fascial fibromatosis: Secondary | ICD-10-CM | POA: Diagnosis not present

## 2023-04-13 MED ORDER — NAPROXEN 500 MG PO TABS: 500.0000 mg | ORAL_TABLET | Freq: Two times a day (BID) | ORAL | 0 refills | Status: AC

## 2023-04-13 NOTE — Discharge Instructions (Addendum)
Follow-up with Dr. Teresita Madura who is on-call for podiatry if your continue to have left foot pain or not improving.  Obtain orthotics/arch supports to go into your shoes to give you more support.  A prescription for anti-inflammatories was sent to the pharmacy for you to take twice a day every day.  Also do the ice bottle to roll under your foot to help stretch out the tissue in your left foot.

## 2023-04-13 NOTE — ED Provider Notes (Signed)
[facility] Provider Note    Event Date/Time   First MD Initiated Contact with Patient 04/13/23 1452     (approximate)   History   Ankle Pain   HPI  Becky Blake is a 27 y.o. female   to the ED with complaint of left ankle pain that began while at work last evening.  Patient states that she has had ice and elevation without any relief.  No over-the-counter medications have been taken.  She denies any recent injuries or previous fractures.  Patient has history of borderline diabetes and bipolar 1 disorder.      Physical Exam   Triage Vital Signs: ED Triage Vitals  Enc Vitals Group     BP 04/13/23 1445 (!) 140/105     Pulse Rate 04/13/23 1445 99     Resp 04/13/23 1445 15     Temp 04/13/23 1445 98.4 F (36.9 C)     Temp Source 04/13/23 1445 Oral     SpO2 04/13/23 1445 100 %     Weight --      Height --      Head Circumference --      Peak Flow --      Pain Score 04/13/23 1446 8     Pain Loc --      Pain Edu? --      Excl. in GC? --     Most recent vital signs: Vitals:   04/13/23 1445 04/13/23 1516  BP: (!) 140/105 (!) 124/90  Pulse: 99 90  Resp: 15 17  Temp: 98.4 F (36.9 C)   SpO2: 100% 99%     General: Awake, no distress.  CV:  Good peripheral perfusion.  Resp:  Normal effort.  Abd:  No distention.  Other:  Left foot without deformity, discoloration or edema present.  Moderate tenderness on palpation of the plantar aspect.  No tenderness on palpation of the calcaneal or Achilles tendon insertion.  Gait is intact.  Pulses are present.   ED Results / Procedures / Treatments   Labs (all labs ordered are listed, but only abnormal results are displayed) Labs Reviewed - No data to display   RADIOLOGY  X-rays of left ankle images were reviewed by myself independent of the radiologist and no fracture or dislocation was noted.   PROCEDURES:  Critical Care performed:   Procedures   MEDICATIONS ORDERED IN  ED: Medications - No data to display   IMPRESSION / MDM / ASSESSMENT AND PLAN / ED COURSE  I reviewed the triage vital signs and the nursing notes.   Differential diagnosis includes, but is not limited to, left foot pain, left foot strain, plantar fasciitis, calcaneal spur, fracture.  27 year old female presents to the ED with complaint of left foot pain that began last evening while at work.  No over-the-counter medication was taken and no history of injury.  X-rays were reassuring and patient was made aware.  Clinically it appears that she has some plantars fasciitis and we have discussed orthotics along with ice.  She is to follow-up with Dr. Teresita Madura who is on-call for podiatry if any continued problems or not improving.  A prescription for naproxen was sent to the pharmacy for her to begin taking twice a day with food.      Patient's presentation is most consistent with acute complicated illness / injury requiring diagnostic workup.  FINAL CLINICAL IMPRESSION(S) / ED DIAGNOSES   Final diagnoses:  Plantar fasciitis of left foot  Rx / DC Orders   ED Discharge Orders          Ordered    naproxen (NAPROSYN) 500 MG tablet  2 times daily with meals        04/13/23 1525             Note:  This document was prepared using Dragon voice recognition software and may include unintentional dictation errors.   Tommi Rumps, PA-C 04/13/23 1528    Jene Every, MD 04/13/23 [phone]

## 2023-04-13 NOTE — ED Triage Notes (Signed)
Pt reports left ankle pain that started last night. Pt denies injury to the area.
